# Patient Record
Sex: Female | Born: 1960 | Race: Black or African American | Hispanic: No | Marital: Single | Smoking: Former smoker
Health system: Southern US, Community
[De-identification: ages and names within clinical notes are randomized; demographics above are authoritative.]

## PROBLEM LIST (undated history)

## (undated) DIAGNOSIS — K589 Irritable bowel syndrome without diarrhea: Secondary | ICD-10-CM

## (undated) DIAGNOSIS — J449 Chronic obstructive pulmonary disease, unspecified: Secondary | ICD-10-CM

## (undated) DIAGNOSIS — F419 Anxiety disorder, unspecified: Secondary | ICD-10-CM

## (undated) DIAGNOSIS — E119 Type 2 diabetes mellitus without complications: Secondary | ICD-10-CM

## (undated) DIAGNOSIS — I10 Essential (primary) hypertension: Secondary | ICD-10-CM

## (undated) DIAGNOSIS — G4730 Sleep apnea, unspecified: Secondary | ICD-10-CM

## (undated) DIAGNOSIS — R0600 Dyspnea, unspecified: Secondary | ICD-10-CM

## (undated) DIAGNOSIS — E669 Obesity, unspecified: Secondary | ICD-10-CM

## (undated) DIAGNOSIS — M1990 Unspecified osteoarthritis, unspecified site: Secondary | ICD-10-CM

## (undated) HISTORY — PX: TUBAL LIGATION: SHX77

## (undated) HISTORY — PX: JOINT REPLACEMENT: SHX530

---

## 1988-07-09 HISTORY — PX: TUBAL LIGATION: SHX77

## 2016-07-09 HISTORY — PX: JOINT REPLACEMENT: SHX530

## 2018-01-28 ENCOUNTER — Encounter: Payer: Self-pay | Admitting: Emergency Medicine

## 2018-01-28 ENCOUNTER — Emergency Department: Payer: Medicaid Other

## 2018-01-28 ENCOUNTER — Inpatient Hospital Stay
Admission: EM | Admit: 2018-01-28 | Discharge: 2018-02-03 | DRG: 373 | Disposition: A | Discharge status: Home / Self Care | Payer: Medicaid Other | Attending: Surgery | Admitting: Surgery

## 2018-01-28 ENCOUNTER — Other Ambulatory Visit: Payer: Self-pay

## 2018-01-28 DIAGNOSIS — E119 Type 2 diabetes mellitus without complications: Secondary | ICD-10-CM | POA: Diagnosis present

## 2018-01-28 DIAGNOSIS — I10 Essential (primary) hypertension: Secondary | ICD-10-CM

## 2018-01-28 DIAGNOSIS — E785 Hyperlipidemia, unspecified: Secondary | ICD-10-CM | POA: Diagnosis present

## 2018-01-28 DIAGNOSIS — Z87891 Personal history of nicotine dependence: Secondary | ICD-10-CM

## 2018-01-28 DIAGNOSIS — M79669 Pain in unspecified lower leg: Secondary | ICD-10-CM | POA: Diagnosis present

## 2018-01-28 DIAGNOSIS — K5900 Constipation, unspecified: Secondary | ICD-10-CM | POA: Diagnosis present

## 2018-01-28 DIAGNOSIS — K3532 Acute appendicitis with perforation and localized peritonitis, without abscess: Secondary | ICD-10-CM | POA: Diagnosis present

## 2018-01-28 DIAGNOSIS — K3533 Acute appendicitis with perforation and localized peritonitis, with abscess: Secondary | ICD-10-CM | POA: Diagnosis present

## 2018-01-28 DIAGNOSIS — J449 Chronic obstructive pulmonary disease, unspecified: Secondary | ICD-10-CM | POA: Diagnosis present

## 2018-01-28 DIAGNOSIS — Z882 Allergy status to sulfonamides status: Secondary | ICD-10-CM | POA: Diagnosis not present

## 2018-01-28 DIAGNOSIS — D259 Leiomyoma of uterus, unspecified: Secondary | ICD-10-CM | POA: Diagnosis present

## 2018-01-28 DIAGNOSIS — E1159 Type 2 diabetes mellitus with other circulatory complications: Secondary | ICD-10-CM

## 2018-01-28 DIAGNOSIS — M79606 Pain in leg, unspecified: Secondary | ICD-10-CM

## 2018-01-28 DIAGNOSIS — E1165 Type 2 diabetes mellitus with hyperglycemia: Secondary | ICD-10-CM

## 2018-01-28 DIAGNOSIS — I152 Hypertension secondary to endocrine disorders: Secondary | ICD-10-CM

## 2018-01-28 DIAGNOSIS — K651 Peritoneal abscess: Secondary | ICD-10-CM

## 2018-01-28 HISTORY — DX: Acute appendicitis with perforation, localized peritonitis, and gangrene, with abscess: K35.33

## 2018-01-28 HISTORY — DX: Type 2 diabetes mellitus without complications: E11.9

## 2018-01-28 HISTORY — DX: Chronic obstructive pulmonary disease, unspecified: J44.9

## 2018-01-28 HISTORY — DX: Essential (primary) hypertension: I10

## 2018-01-28 LAB — COMPREHENSIVE METABOLIC PANEL
ALBUMIN: 3.4 g/dL — AB (ref 3.5–5.0)
ALT: 10 U/L (ref 0–44)
AST: 14 U/L — ABNORMAL LOW (ref 15–41)
Alkaline Phosphatase: 92 U/L (ref 38–126)
Anion gap: 10 (ref 5–15)
BUN: 10 mg/dL (ref 6–20)
CHLORIDE: 102 mmol/L (ref 98–111)
CO2: 27 mmol/L (ref 22–32)
CREATININE: 0.7 mg/dL (ref 0.44–1.00)
Calcium: 8.5 mg/dL — ABNORMAL LOW (ref 8.9–10.3)
GFR calc Af Amer: >60 mL/min (ref >60)
GLUCOSE: 106 mg/dL — AB (ref 70–99)
POTASSIUM: 3.6 mmol/L (ref 3.5–5.1)
Sodium: 139 mmol/L (ref 135–145)
Total Bilirubin: 0.9 mg/dL (ref 0.3–1.2)
Total Protein: 8.1 g/dL (ref 6.5–8.1)

## 2018-01-28 LAB — CBC WITH DIFFERENTIAL/PLATELET
BASOS PCT: 0 %
Basophils Absolute: 0.1 10*3/uL (ref 0–0.1)
Eosinophils Absolute: 0.3 10*3/uL (ref 0–0.7)
Eosinophils Relative: 2 %
HCT: 41.9 % (ref 35.0–47.0)
Hemoglobin: 14.1 g/dL (ref 12.0–16.0)
Lymphocytes Relative: 15 %
Lymphs Abs: 2.4 10*3/uL (ref 1.0–3.6)
MCH: 26.9 pg (ref 26.0–34.0)
MCHC: 33.6 g/dL (ref 32.0–36.0)
MCV: 79.9 fL — AB (ref 80.0–100.0)
MONO ABS: 1 10*3/uL — AB (ref 0.2–0.9)
Monocytes Relative: 6 %
Neutro Abs: 12.9 10*3/uL — ABNORMAL HIGH (ref 1.4–6.5)
Neutrophils Relative %: 77 %
Platelets: 290 10*3/uL (ref 150–440)
RBC: 5.24 MIL/uL — AB (ref 3.80–5.20)
RDW: 15.3 % — ABNORMAL HIGH (ref 11.5–14.5)
WBC: 16.7 10*3/uL — ABNORMAL HIGH (ref 3.6–11.0)

## 2018-01-28 LAB — LIPASE, BLOOD: Lipase: 36 U/L (ref 11–51)

## 2018-01-28 LAB — GLUCOSE, CAPILLARY: Glucose-Capillary: 124 mg/dL — ABNORMAL HIGH (ref 70–99)

## 2018-01-28 MED ORDER — TRAMADOL HCL 50 MG PO TABS: 50.0000 mg | ORAL_TABLET | Freq: Four times a day (QID) | ORAL | Status: DC | PRN

## 2018-01-28 MED ORDER — ONDANSETRON 4 MG PO TBDP: 4.0000 mg | ORAL_TABLET | Freq: Four times a day (QID) | ORAL | Status: DC | PRN

## 2018-01-28 MED ORDER — PIPERACILLIN-TAZOBACTAM 3.375 G IVPB 30 MIN
3.3750 g | Freq: Once | INTRAVENOUS | Status: AC
  Administered 2018-01-28: 3.375 g via INTRAVENOUS
  Filled 2018-01-28: qty 50

## 2018-01-28 MED ORDER — PRAVASTATIN SODIUM 20 MG PO TABS
10.0000 mg | ORAL_TABLET | Freq: Every day | ORAL | Status: DC
  Administered 2018-01-29 – 2018-02-03 (×5): 10 mg via ORAL
  Filled 2018-01-28 (×5): qty 1

## 2018-01-28 MED ORDER — INSULIN ASPART 100 UNIT/ML ~~LOC~~ SOLN: 0.0000 [IU] | Freq: Every day | SUBCUTANEOUS | Status: DC

## 2018-01-28 MED ORDER — AMLODIPINE BESYLATE 10 MG PO TABS
10.0000 mg | ORAL_TABLET | Freq: Every day | ORAL | Status: DC
  Administered 2018-01-29 – 2018-02-03 (×5): 10 mg via ORAL
  Filled 2018-01-28 (×5): qty 1

## 2018-01-28 MED ORDER — FUROSEMIDE 40 MG PO TABS
40.0000 mg | ORAL_TABLET | Freq: Every day | ORAL | Status: DC
  Administered 2018-01-29 – 2018-02-03 (×5): 40 mg via ORAL
  Filled 2018-01-28 (×5): qty 1

## 2018-01-28 MED ORDER — ONDANSETRON HCL 4 MG/2ML IJ SOLN
4.0000 mg | Freq: Once | INTRAMUSCULAR | Status: AC
  Administered 2018-01-28: 4 mg via INTRAVENOUS
  Filled 2018-01-28: qty 2

## 2018-01-28 MED ORDER — SODIUM CHLORIDE 0.9 % IV BOLUS
1000.0000 mL | Freq: Once | INTRAVENOUS | Status: AC
  Administered 2018-01-28: 1000 mL via INTRAVENOUS

## 2018-01-28 MED ORDER — MORPHINE SULFATE (PF) 2 MG/ML IV SOLN
2.0000 mg | INTRAVENOUS | Status: DC | PRN
  Administered 2018-01-29 – 2018-02-02 (×12): 2 mg via INTRAVENOUS
  Filled 2018-01-28 (×12): qty 1

## 2018-01-28 MED ORDER — LACTATED RINGERS IV SOLN
INTRAVENOUS | Status: DC
  Administered 2018-01-28 – 2018-01-30 (×4): via INTRAVENOUS

## 2018-01-28 MED ORDER — ONDANSETRON 4 MG PO TBDP
4.0000 mg | ORAL_TABLET | Freq: Once | ORAL | Status: AC | PRN
  Administered 2018-01-28: 4 mg via ORAL
  Filled 2018-01-28: qty 1

## 2018-01-28 MED ORDER — DICYCLOMINE HCL 10 MG PO CAPS
10.0000 mg | ORAL_CAPSULE | Freq: Four times a day (QID) | ORAL | Status: DC
  Administered 2018-01-28 – 2018-02-03 (×20): 10 mg via ORAL
  Filled 2018-01-28 (×20): qty 1

## 2018-01-28 MED ORDER — HYDROCODONE-ACETAMINOPHEN 5-325 MG PO TABS
1.0000 | ORAL_TABLET | ORAL | Status: DC | PRN
  Administered 2018-01-29 – 2018-02-03 (×9): 2 via ORAL
  Filled 2018-01-28 (×9): qty 2

## 2018-01-28 MED ORDER — ONDANSETRON HCL 4 MG/2ML IJ SOLN
4.0000 mg | Freq: Four times a day (QID) | INTRAMUSCULAR | Status: DC | PRN
  Administered 2018-01-30 – 2018-02-02 (×2): 4 mg via INTRAVENOUS
  Filled 2018-01-28 (×2): qty 2

## 2018-01-28 MED ORDER — PIPERACILLIN-TAZOBACTAM 3.375 G IVPB
3.3750 g | Freq: Three times a day (TID) | INTRAVENOUS | Status: DC
  Administered 2018-01-29 – 2018-02-03 (×17): 3.375 g via INTRAVENOUS
  Filled 2018-01-28 (×17): qty 50

## 2018-01-28 MED ORDER — IOPAMIDOL (ISOVUE-300) INJECTION 61%
100.0000 mL | Freq: Once | INTRAVENOUS | Status: AC | PRN
  Administered 2018-01-28: 100 mL via INTRAVENOUS

## 2018-01-28 MED ORDER — INSULIN ASPART 100 UNIT/ML ~~LOC~~ SOLN
0.0000 [IU] | Freq: Three times a day (TID) | SUBCUTANEOUS | Status: DC
  Administered 2018-01-31 – 2018-02-03 (×4): 3 [IU] via SUBCUTANEOUS
  Filled 2018-01-28 (×4): qty 1

## 2018-01-28 MED ORDER — AMITRIPTYLINE HCL 25 MG PO TABS
75.0000 mg | ORAL_TABLET | Freq: Every day | ORAL | Status: DC
  Administered 2018-01-29 – 2018-02-02 (×6): 75 mg via ORAL
  Filled 2018-01-28 (×7): qty 3

## 2018-01-28 MED ORDER — MORPHINE SULFATE (PF) 4 MG/ML IV SOLN
4.0000 mg | Freq: Once | INTRAVENOUS | Status: AC
  Administered 2018-01-28: 4 mg via INTRAVENOUS
  Filled 2018-01-28: qty 1

## 2018-01-28 MED ORDER — ENOXAPARIN SODIUM 40 MG/0.4ML ~~LOC~~ SOLN
40.0000 mg | Freq: Two times a day (BID) | SUBCUTANEOUS | Status: DC
  Administered 2018-01-29 – 2018-02-03 (×10): 40 mg via SUBCUTANEOUS
  Filled 2018-01-28 (×10): qty 0.4

## 2018-01-28 NOTE — ED Provider Notes (Signed)
[facility] Emergency Department Provider Note  ____________________________________________  Time seen: Approximately 6:29 PM  I have reviewed the triage vital signs and the nursing notes.   HISTORY  Chief Complaint Abdominal Pain   HPI Tracy Hood is a 57 y.o. female with a history of COPD, diabetes, hypertension, hyperlipidemia who presents for evaluation of abdominal pain.  Patient reports 4 days of constant sharp right lower quadrant abdominal pain associated with nausea and decreased appetite.  She has had chills but no fever.  She denies ever having similar pain.  Initially she thought the pain was due to gas but the pain is not resolving and is getting progressively worse.  At this time the pain is 8 out of 10.  No prior abdominal surgeries.  No dysuria, hematuria, vaginal bleeding, diarrhea, or vomiting.  She reports being constipated for the last 4 days which is atypical for her.  She took a laxative at home with no resolution.  Past Medical History:  Diagnosis Date  . COPD (chronic obstructive pulmonary disease) ([place])   . Diabetes mellitus without complication ([place])   . Hypertension     Past Surgical History:  Procedure Laterality Date  . JOINT REPLACEMENT    . TUBAL LIGATION      Allergies Sulfa antibiotics  FH Diabetes HTN  Social History Social History   Tobacco Use  . Smoking status: Former Research scientist (life sciences)  . Smokeless tobacco: Never Used  Substance Use Topics  . Alcohol use: Yes  . Drug use: Never    Review of Systems  Constitutional: Negative for fever. Eyes: Negative for visual changes. ENT: Negative for sore throat. Neck: No neck pain  Cardiovascular: Negative for chest pain. Respiratory: Negative for shortness of breath. Gastrointestinal: + R sided abdominal pain, nausea, decrease appetite, and constipation. Genitourinary: Negative for dysuria. Musculoskeletal: Negative for back pain. Skin: Negative for  rash. Neurological: Negative for headaches, weakness or numbness. Psych: No SI or HI  ____________________________________________   PHYSICAL EXAM:  VITAL SIGNS: ED Triage Vitals [01/28/18 1807]  Enc Vitals Group     BP (!) 153/72     Pulse Rate 93     Resp 18     Temp 98.3 F (36.8 C)     Temp Source Oral     SpO2 92 %     Weight 292 lb (132.5 kg)     Height 5\' 7"  (1.702 m)     Head Circumference      Peak Flow      Pain Score 7     Pain Loc      Pain Edu?      Excl. in [place]?     Constitutional: Alert and oriented. Well appearing and in no apparent distress. HEENT:      Head: Normocephalic and atraumatic.         Eyes: Conjunctivae are normal. Sclera is non-icteric.       Mouth/Throat: Mucous membranes are moist.       Neck: Supple with no signs of meningismus. Cardiovascular: Regular rate and rhythm. No murmurs, gallops, or rubs. 2+ symmetrical distal pulses are present in all extremities. No JVD. Respiratory: Normal respiratory effort. Lungs are clear to auscultation bilaterally. No wheezes, crackles, or rhonchi.  Gastrointestinal: Soft, diffuse right-sided tenderness worse in the right lower quadrant, and non distended with positive bowel sounds. No rebound or guarding. Genitourinary: No CVA tenderness. Musculoskeletal: Nontender with normal range of motion in all extremities. No edema, cyanosis, or erythema of  extremities. Neurologic: Normal speech and language. Face is symmetric. Moving all extremities. No gross focal neurologic deficits are appreciated. Skin: Skin is warm, dry and intact. No rash noted. Psychiatric: Mood and affect are normal. Speech and behavior are normal.  ____________________________________________   LABS (all labs ordered are listed, but only abnormal results are displayed)  Labs Reviewed  CBC WITH DIFFERENTIAL/PLATELET - Abnormal; Notable for the following components:      Result Value   WBC 16.7 (*)    RBC 5.24 (*)    MCV 79.9 (*)     RDW 15.3 (*)    Neutro Abs 12.9 (*)    Monocytes Absolute 1.0 (*)    All other components within normal limits  COMPREHENSIVE METABOLIC PANEL - Abnormal; Notable for the following components:   Glucose, Bld 106 (*)    Calcium 8.5 (*)    Albumin 3.4 (*)    AST 14 (*)    All other components within normal limits  LIPASE, BLOOD  URINALYSIS, COMPLETE (UACMP) WITH MICROSCOPIC   ____________________________________________  EKG  none  ____________________________________________  RADIOLOGY  I have personally reviewed the images performed during this visit and I agree with the Radiologist's read.   Interpretation by Radiologist:  No results found.    ____________________________________________   PROCEDURES  Procedure(s) performed: None Procedures Critical Care performed:  Yes  CRITICAL CARE Performed by: Rudene Re  ?  Total critical care time: 30 min  Critical care time was exclusive of separately billable procedures and treating other patients.  Critical care was necessary to treat or prevent imminent or life-threatening deterioration.  Critical care was time spent personally by me on the following activities: development of treatment plan with patient and/or surrogate as well as nursing, discussions with consultants, evaluation of patient's response to treatment, examination of patient, obtaining history from patient or surrogate, ordering and performing treatments and interventions, ordering and review of laboratory studies, ordering and review of radiographic studies, pulse oximetry and re-evaluation of patient's condition.  ____________________________________________   INITIAL IMPRESSION / ASSESSMENT AND PLAN / ED COURSE  57 y.o. female with a history of COPD, diabetes, hypertension, hyperlipidemia who presents for evaluation of R sided abdominal pain, nausea, decreased appetite and constipation x 4 days.  Patient is well-appearing and in no  distress, she has normal vital signs, abdomen is obese with tenderness to palpation on the right side worse on the right lower quadrant, there is no rebound or guarding.  Differential diagnosis includes but not limited to appendicitis versus diverticulitis versus gallbladder disease versus constipation versus SBO versus kidney stone versus pyelonephritis versus ovarian pathology.  Plan for CBC, CMP, lipase, urinalysis.  Patient no longer having menstrual periods.  Plan for CT abdomen pelvis.  Will give morphine, Zofran and fluids for symptom relief.    _________________________ 8:47 PM on 01/28/2018 -----------------------------------------  CT consistent with perforated appendicitis.  White count is 16.  Patient is hemodynamically stable with no signs of sepsis.  Will start patient on Zosyn.  Discussed with Dr. Lysle Pearl, surgeon on call who will come to the ED to evaluate patient for admission.   As part of my medical decision making, I reviewed the following data within the [place] notes reviewed and incorporated, Labs reviewed , Old EKG reviewed, Radiograph reviewed , A consult was requested and obtained from this/these consultant(s) Surgery, Notes from prior ED visits and [place] Controlled Substance Database    Pertinent labs & imaging results that were available during my  care of the patient were reviewed by me and considered in my medical decision making (see chart for details).    ____________________________________________   FINAL CLINICAL IMPRESSION(S) / ED DIAGNOSES  Final diagnoses:  Acute perforated appendicitis      NEW MEDICATIONS STARTED DURING THIS VISIT:  ED Discharge Orders    None       Note:  This document was prepared using Dragon voice recognition software and may include unintentional dictation errors.    Alfred Levins, [place], MD 01/28/18 [phone]

## 2018-01-28 NOTE — ED Notes (Signed)
Pt unable to void at this time - MD aware 

## 2018-01-28 NOTE — Progress Notes (Signed)
Lovenox changed to 40 mg BID for BMI >40 and CrCl >30. 

## 2018-01-28 NOTE — ED Triage Notes (Signed)
PT arrived with concerns with lower right quadrant abdominal pain that started 4 days prior. Pt reports the pain is constant and feels sharp. Pt states she has had a poor appetite.

## 2018-01-28 NOTE — Progress Notes (Signed)
Pharmacy Antibiotic Note  Tracy Hood is a 57 y.o. female admitted on 01/28/2018 with abdominal pain.  Pharmacy has been consulted for Zosyn dosing.  Plan: Zosyn 3.375g IV q8h (4 hour infusion).  Height: 5\' 7"  (170.2 cm) Weight: 292 lb (132.5 kg) IBW/kg (Calculated) : 61.6  Temp (24hrs), Avg:98.2 F (36.8 C), Min:98 F (36.7 C), Max:98.3 F (36.8 C)  Recent Labs  Lab 01/28/18 1839 01/28/18 1923  WBC 16.7*  --   CREATININE  --  0.70    Estimated Creatinine Clearance: 114.2 mL/min (by C-G formula based on SCr of 0.7 mg/dL).    Allergies  Allergen Reactions  . Sulfa Antibiotics     Itching     Antimicrobials this admission: Zosyn 7/23 >>    >>   Dose adjustments this admission:   Microbiology results: No micro  Thank you for allowing pharmacy to be a part of this patient's care.  Derrick Orris S 01/28/2018 11:39 PM

## 2018-01-29 LAB — CBC
HCT: 37.6 % (ref 35.0–47.0)
Hemoglobin: 12.8 g/dL (ref 12.0–16.0)
MCH: 27.7 pg (ref 26.0–34.0)
MCHC: 34.2 g/dL (ref 32.0–36.0)
MCV: 81.2 fL (ref 80.0–100.0)
PLATELETS: 250 10*3/uL (ref 150–440)
RBC: 4.63 MIL/uL (ref 3.80–5.20)
RDW: 15.1 % — ABNORMAL HIGH (ref 11.5–14.5)
WBC: 13.1 10*3/uL — AB (ref 3.6–11.0)

## 2018-01-29 LAB — URINALYSIS, COMPLETE (UACMP) WITH MICROSCOPIC
BACTERIA UA: NONE SEEN
Bilirubin Urine: NEGATIVE
GLUCOSE, UA: NEGATIVE mg/dL
KETONES UR: NEGATIVE mg/dL
Leukocytes, UA: NEGATIVE
NITRITE: NEGATIVE
PROTEIN: NEGATIVE mg/dL
Specific Gravity, Urine: 1.01 (ref 1.005–1.030)
pH: 5 (ref 5.0–8.0)

## 2018-01-29 LAB — BASIC METABOLIC PANEL
Anion gap: 7 (ref 5–15)
BUN: 10 mg/dL (ref 6–20)
CO2: 28 mmol/L (ref 22–32)
Calcium: 8.2 mg/dL — ABNORMAL LOW (ref 8.9–10.3)
Chloride: 104 mmol/L (ref 98–111)
Creatinine, Ser: 0.79 mg/dL (ref 0.44–1.00)
GFR calc non Af Amer: >60 mL/min (ref >60)
Glucose, Bld: 111 mg/dL — ABNORMAL HIGH (ref 70–99)
Potassium: 3.5 mmol/L (ref 3.5–5.1)
Sodium: 139 mmol/L (ref 135–145)

## 2018-01-29 LAB — GLUCOSE, CAPILLARY
GLUCOSE-CAPILLARY: 98 mg/dL (ref 70–99)
GLUCOSE-CAPILLARY: 104 mg/dL — AB (ref 70–99)
Glucose-Capillary: 107 mg/dL — ABNORMAL HIGH (ref 70–99)
Glucose-Capillary: 89 mg/dL (ref 70–99)

## 2018-01-29 MED ORDER — ACETAMINOPHEN 325 MG PO TABS
650.0000 mg | ORAL_TABLET | Freq: Four times a day (QID) | ORAL | Status: DC | PRN
  Administered 2018-01-29 – 2018-01-30 (×2): 650 mg via ORAL
  Filled 2018-01-29 (×2): qty 2

## 2018-01-29 MED ORDER — SODIUM CHLORIDE 0.9 % IV SOLN
INTRAVENOUS | Status: DC
  Administered 2018-01-30: 11:00:00 via INTRAVENOUS

## 2018-01-29 NOTE — Progress Notes (Signed)
Subjective:    Tracy Hood is a 57 y.o. female  Hospital stay day 1,   perforate appendicitis  Feeling slightly better today.   Objective:      Temp:  [98 F (36.7 C)] 98 F (36.7 C) (07/23 2326) Pulse Rate:  [57-85] 85 (07/24 1141) Resp:  [16-18] 18 (07/23 2326) BP: (129-173)/(68-83) 131/72 (07/24 1141) SpO2:  [89 %-100 %] 100 % (07/24 1141)     Height: 5\' 7"  (170.2 cm) Weight: 132.5 kg (292 lb) BMI (Calculated): 45.72   Intake/Output this shift:  No intake/output data recorded.   Intake/Output last 2 shifts:  @IOLAST2SHIFTS @    Constitutional :  alert, cooperative, appears stated age and no distress  Respiratory:  clear to auscultation bilaterally  Cardiovascular:  regular rate and rhythm, S1, S2 normal, no murmur, click, rub or gallop  Gastrointestinal: soft, point tenderness along right side and suprapubic region.   Skin: Cool and moist.   Psychiatric: Normal affect, non-agitated, not confused       LABS:  CMP Latest Ref Rng & Units 01/29/2018 01/28/2018  Glucose 70 - 99 mg/dL 111(H) 106(H)  BUN 6 - 20 mg/dL 10 10  Creatinine 0.44 - 1.00 mg/dL 0.79 0.70  Sodium 135 - 145 mmol/L 139 139  Potassium 3.5 - 5.1 mmol/L 3.5 3.6  Chloride 98 - 111 mmol/L 104 102  CO2 22 - 32 mmol/L 28 27  Calcium 8.9 - 10.3 mg/dL 8.2(L) 8.5(L)  Total Protein 6.5 - 8.1 g/dL - 8.1  Total Bilirubin 0.3 - 1.2 mg/dL - 0.9  Alkaline Phos 38 - 126 U/L - 92  AST 15 - 41 U/L - 14(L)  ALT 0 - 44 U/L - 10   CBC Latest Ref Rng & Units 01/29/2018 01/28/2018  WBC 3.6 - 11.0 K/uL 13.1(H) 16.7(H)  Hemoglobin 12.0 - 16.0 g/dL 12.8 14.1  Hematocrit 35.0 - 47.0 % 37.6 41.9  Platelets 150 - 440 K/uL 250 290    RADS: none Assessment:     perforated appendicitis, wbc improving with npo, abx.  IR consult for percutaneous drainage of appendiceal abscess tomorrow. Plan:   IVF: LR@100ml  Abx: cont zosyn DVT prophy: lovenox, SCDs GI prophy: pepcid Diet: NPO, sips with meds Pain control: prn I&O:  qshift Activity: adlib Labs: am labs

## 2018-01-29 NOTE — H&P (Signed)
Subjective:  LATE ENTRY CC: perforated appendicitis  HPI:  Tracy Hood is a 57 y.o. female who is consulted for evaluation of  Perforated appendicitis. Symptoms were first noted 4 days ago. Pain is sharp, localized to RLQ and infraumbilcal area.  Associated with some nausea, exacerbated by nothing specific.  Pain continued to worsen so came to ED, where CT scan showed perforated appendicitis.     Past Medical History:  has a past medical history of COPD (chronic obstructive pulmonary disease) ([place]), Diabetes mellitus without complication ([place]), and Hypertension.  Past Surgical History:  has a past surgical history that includes Joint replacement and Tubal ligation.  Family History: family history is not on file.  Social History:  reports that she has quit smoking. She has never used smokeless tobacco. She reports that she drinks alcohol. She reports that she does not use drugs.  Current Medications: see MAR  Allergies:  Allergies as of 01/28/2018 - Review Complete 01/28/2018  Allergen Reaction Noted  . Sulfa antibiotics  01/28/2018    ROS:  A 15 point review of systems was performed and was negative except as noted in HPI    Objective:     BP (!) 142/83 (BP Location: Right Arm)   Pulse (!) 57   Temp 98 F (36.7 C) (Oral)   Resp 18   Ht 5\' 7"  (1.702 m)   Wt 132.5 kg (292 lb)   SpO2 99%   BMI 45.73 kg/m    Constitutional :  alert, cooperative, appears stated age and no distress  Lymphatics/Throat:  no asymmetry, masses, or scars  Respiratory:  clear to auscultation bilaterally  Cardiovascular:  regular rate and rhythm, S1, S2 normal, no murmur, click, rub or gallop  Gastrointestinal: soft, but focal tenderness in RLQ, periumbilical region.  No rebound or guarding.   Musculoskeletal: Steady gait and movement  Skin: Cool and moist, no obvious surgical scars  Psychiatric: Normal affect, non-agitated, not confused       LABS:  CMP Latest Ref Rng & Units 01/29/2018  01/28/2018  Glucose 70 - 99 mg/dL 111(H) 106(H)  BUN 6 - 20 mg/dL 10 10  Creatinine 0.44 - 1.00 mg/dL 0.79 0.70  Sodium 135 - 145 mmol/L 139 139  Potassium 3.5 - 5.1 mmol/L 3.5 3.6  Chloride 98 - 111 mmol/L 104 102  CO2 22 - 32 mmol/L 28 27  Calcium 8.9 - 10.3 mg/dL 8.2(L) 8.5(L)  Total Protein 6.5 - 8.1 g/dL - 8.1  Total Bilirubin 0.3 - 1.2 mg/dL - 0.9  Alkaline Phos 38 - 126 U/L - 92  AST 15 - 41 U/L - 14(L)  ALT 0 - 44 U/L - 10   CBC Latest Ref Rng & Units 01/29/2018 01/28/2018  WBC 3.6 - 11.0 K/uL 13.1(H) 16.7(H)  Hemoglobin 12.0 - 16.0 g/dL 12.8 14.1  Hematocrit 35.0 - 47.0 % 37.6 41.9  Platelets 150 - 440 K/uL 250 290     RADS:  CLINICAL DATA:  Right lower quadrant abdominal pain  EXAM: CT ABDOMEN AND PELVIS WITH CONTRAST  TECHNIQUE: Multidetector CT imaging of the abdomen and pelvis was performed using the standard protocol following bolus administration of intravenous contrast.  CONTRAST:  136mL ISOVUE-300 IOPAMIDOL (ISOVUE-300) INJECTION 61%  COMPARISON:  None.  FINDINGS: Lower chest: Lung bases demonstrate no acute consolidation or pleural effusion. Borderline cardiomegaly. Mitral calcification. Small hiatal hernia  Hepatobiliary: No focal liver abnormality is seen. No gallstones, gallbladder wall thickening, or biliary dilatation.  Pancreas: Unremarkable. No pancreatic ductal dilatation  or surrounding inflammatory changes.  Spleen: Normal in size without focal abnormality.  Adrenals/Urinary Tract: Adrenal glands are unremarkable. Kidneys are normal, without renal calculi, focal lesion, or hydronephrosis. Bladder is unremarkable.  Stomach/Bowel: The stomach is nonenlarged. No dilated small bowel. Enlarged inflamed appendix, measuring up to 14 mm. 3.8 cm gas and fluid collection in the right lower quadrant adjacent to the tip of the appendix. Punctate calcification in the right lower quadrant near the inflammatory process. No colon wall  thickening  Vascular/Lymphatic: Mild aortic atherosclerosis. No aneurysm. No significantly enlarged lymph nodes  Reproductive: Markedly enlarged uterus which extends to the level of the umbilicus. No adnexal mass  Other: Negative for free air. Small amount of fluid in the right lower quadrant. Small fat containing umbilical and periumbilical hernia with small calcification at the base of the umbilicus.  Musculoskeletal: Degenerative changes with trace anterolisthesis L4 on L5. No acute or suspicious abnormality  IMPRESSION: 1. Findings are consistent with acute perforated appendicitis. Perforation appears contained in the right lower quadrant where there is a 3.8 cm gas and fluid collection.  Appendix: Location: Right lower quadrant  Diameter: 14 mm  Appendicolith: Punctate stone in the right lower quadrant may reflect punctate appendicoliths but appears extraluminal.  Mucosal hyper-enhancement: Negative  Extraluminal gas: Positive for 3.8 cm gas and fluid collection in the right lower quadrant near the tip of the appendix.  Periappendiceal collection: Positive for 3.8 cm gas and fluid collection in the right lower quadrant near the tip of the appendix.  2. Massive enlargement of the uterus which extends to the level of the umbilicus, probably due to multiple large uterine fibroids.  Critical Value/emergent results were called by telephone at the time of interpretation on 01/28/2018 at 8:46 pm to Dr. Rudene Re , who verbally acknowledged these results.   Electronically Signed   By: Donavan Foil M.D.   On: 01/28/2018 20:46 Assessment:     perforated appendicitis.  Clinically stable.  Plan:      Due to abscess formation and duration of symptoms, will complete abx course and consider interval appendectomy.  Admit: medsurg IVF: LR@100ml  Abx: zosyn DVT prophy: lovenox, SCDs GI prophy: pepcid Diet: NPO, sips with meds Pain control:  prn I&O: qshift Activity: adlib Labs: daily cbc, bmp  COPD -stable.. Cont to monitor  DM -SSI  HTN -cont home meds

## 2018-01-30 ENCOUNTER — Inpatient Hospital Stay: Payer: Medicaid Other

## 2018-01-30 LAB — BASIC METABOLIC PANEL
Anion gap: 10 (ref 5–15)
BUN: 11 mg/dL (ref 6–20)
CHLORIDE: 102 mmol/L (ref 98–111)
CO2: 27 mmol/L (ref 22–32)
Calcium: 8.2 mg/dL — ABNORMAL LOW (ref 8.9–10.3)
Creatinine, Ser: 0.72 mg/dL (ref 0.44–1.00)
GFR calc Af Amer: >60 mL/min (ref >60)
GFR calc non Af Amer: >60 mL/min (ref >60)
GLUCOSE: 104 mg/dL — AB (ref 70–99)
POTASSIUM: 3.6 mmol/L (ref 3.5–5.1)
Sodium: 139 mmol/L (ref 135–145)

## 2018-01-30 LAB — CBC WITH DIFFERENTIAL/PLATELET
BASOS ABS: 0.1 10*3/uL (ref 0–0.1)
BASOS PCT: 1 %
EOS ABS: 0.2 10*3/uL (ref 0–0.7)
Eosinophils Relative: 2 %
HEMATOCRIT: 38.3 % (ref 35.0–47.0)
HEMOGLOBIN: 12.7 g/dL (ref 12.0–16.0)
Lymphocytes Relative: 9 %
Lymphs Abs: 1.1 10*3/uL (ref 1.0–3.6)
MCH: 26.9 pg (ref 26.0–34.0)
MCHC: 33.1 g/dL (ref 32.0–36.0)
MCV: 81.2 fL (ref 80.0–100.0)
MONOS PCT: 7 %
Monocytes Absolute: 0.8 10*3/uL (ref 0.2–0.9)
NEUTROS ABS: 9.7 10*3/uL — AB (ref 1.4–6.5)
NEUTROS PCT: 81 %
Platelets: 243 10*3/uL (ref 150–440)
RBC: 4.72 MIL/uL (ref 3.80–5.20)
RDW: 14.7 % — ABNORMAL HIGH (ref 11.5–14.5)
WBC: 12 10*3/uL — AB (ref 3.6–11.0)

## 2018-01-30 LAB — GLUCOSE, CAPILLARY
Glucose-Capillary: 109 mg/dL — ABNORMAL HIGH (ref 70–99)
Glucose-Capillary: 81 mg/dL (ref 70–99)
Glucose-Capillary: 61 mg/dL — ABNORMAL LOW (ref 70–99)
Glucose-Capillary: 119 mg/dL — ABNORMAL HIGH (ref 70–99)
Glucose-Capillary: 161 mg/dL — ABNORMAL HIGH (ref 70–99)

## 2018-01-30 LAB — HIV ANTIBODY (ROUTINE TESTING W REFLEX): HIV Screen 4th Generation wRfx: NONREACTIVE

## 2018-01-30 LAB — PROTIME-INR
INR: 1.08
Prothrombin Time: 13.9 seconds (ref 11.4–15.2)

## 2018-01-30 LAB — APTT: APTT: 38 s — AB (ref 24–36)

## 2018-01-30 MED ORDER — FENTANYL CITRATE (PF) 100 MCG/2ML IJ SOLN
INTRAMUSCULAR | Status: AC | PRN
  Administered 2018-01-30: 25 ug via INTRAVENOUS
  Administered 2018-01-30: 50 ug via INTRAVENOUS

## 2018-01-30 MED ORDER — MIDAZOLAM HCL 5 MG/5ML IJ SOLN
INTRAMUSCULAR | Status: AC | PRN
  Administered 2018-01-30: 0.5 mg via INTRAVENOUS
  Administered 2018-01-30: 1 mg via INTRAVENOUS

## 2018-01-30 MED ORDER — LIDOCAINE HCL (PF) 1 % IJ SOLN
INTRAMUSCULAR | Status: AC | PRN
  Administered 2018-01-30: 10 mL

## 2018-01-30 MED ORDER — HYDROMORPHONE HCL 1 MG/ML IJ SOLN
0.5000 mg | Freq: Once | INTRAMUSCULAR | Status: AC
  Administered 2018-01-30: 0.5 mg via INTRAVENOUS
  Filled 2018-01-30: qty 0.5

## 2018-01-30 MED ORDER — FENTANYL CITRATE (PF) 100 MCG/2ML IJ SOLN
INTRAMUSCULAR | Status: AC
  Administered 2018-01-30: 12:00:00
  Filled 2018-01-30: qty 4

## 2018-01-30 MED ORDER — MIDAZOLAM HCL 5 MG/5ML IJ SOLN
INTRAMUSCULAR | Status: AC
  Administered 2018-01-30: 12:00:00
  Filled 2018-01-30: qty 5

## 2018-01-30 MED ORDER — SODIUM CHLORIDE 0.9% FLUSH
5.0000 mL | Freq: Three times a day (TID) | INTRAVENOUS | Status: DC
Start: 2018-01-30 — End: 2018-02-03
  Administered 2018-01-30 – 2018-02-03 (×11): 5 mL

## 2018-01-30 NOTE — Progress Notes (Signed)
Subjective:  LATE ENTRY  Tracy Hood is a 57 y.o. female  Hospital stay day 2,   perforated appendicitis  No issues overnight.  Pending IR drain placement   Objective:      Temp:  [97.6 F (36.4 C)-100.5 F (38.1 C)] 100.5 F (38.1 C) (07/25 2034) Pulse Rate:  [71-106] 106 (07/25 2034) Resp:  [14-18] 18 (07/25 2034) BP: (137-159)/(61-88) 141/69 (07/25 2034) SpO2:  [93 %-99 %] 94 % (07/25 2034)     Height: 5\' 7"  (170.2 cm) Weight: 132.5 kg (292 lb) BMI (Calculated): 45.72   Intake/Output this shift:  Total I/O In: -  Out: 700 [Urine:700]   Intake/Output last 2 shifts:     Constitutional :  alert, cooperative and appears stated age  Respiratory:  clear to auscultation bilaterally  Cardiovascular:  regular rate and rhythm, S1, S2 normal, no murmur, click, rub or gallop  Gastrointestinal: soft, with focal tenderness in RLQ, unchanged from previous.   Skin: Cool and moist.   Psychiatric: Normal affect, non-agitated, not confused       LABS:  CMP Latest Ref Rng & Units 01/30/2018 01/29/2018 01/28/2018  Glucose 70 - 99 mg/dL 104(H) 111(H) 106(H)  BUN 6 - 20 mg/dL 11 10 10   Creatinine 0.44 - 1.00 mg/dL 0.72 0.79 0.70  Sodium 135 - 145 mmol/L 139 139 139  Potassium 3.5 - 5.1 mmol/L 3.6 3.5 3.6  Chloride 98 - 111 mmol/L 102 104 102  CO2 22 - 32 mmol/L 27 28 27   Calcium 8.9 - 10.3 mg/dL 8.2(L) 8.2(L) 8.5(L)  Total Protein 6.5 - 8.1 g/dL - - 8.1  Total Bilirubin 0.3 - 1.2 mg/dL - - 0.9  Alkaline Phos 38 - 126 U/L - - 92  AST 15 - 41 U/L - - 14(L)  ALT 0 - 44 U/L - - 10   CBC Latest Ref Rng & Units 01/30/2018 01/29/2018 01/28/2018  WBC 3.6 - 11.0 K/uL 12.0(H) 13.1(H) 16.7(H)  Hemoglobin 12.0 - 16.0 g/dL 12.7 12.8 14.1  Hematocrit 35.0 - 47.0 % 38.3 37.6 41.9  Platelets 150 - 440 K/uL 243 250 290    RADS: n/a Assessment:        Perforated appendicitis, pending IR drainage.  Continue present management until drain placed, then ok for CLD

## 2018-01-30 NOTE — Procedures (Signed)
Interventional Radiology Procedure Note  Procedure: CT guided drainage of appendiceal abscess  Complications: None  Estimated Blood Loss: < 10 mL  Findings: Aspiration of appendiceal abscess yielded purulent, feculent, bloody fluid.  Sample sent for culture. 10 Fr drain placed and formed in abscess.  Attached to suction bulb drainage.  Venetia Night. Kathlene Cote, M.D Pager:  [phone]

## 2018-01-30 NOTE — Progress Notes (Signed)
Interventional Radiology  CT reviewed from 7/23.  Fairly small, early appendiceal abscess with not much fluid component, but should be able to get a small drain into area under CT guidance today.    Risks and benefits discussed with the patient including bleeding, infection, damage to adjacent structures, bowel perforation/fistula connection, and sepsis. All of the patient's questions were answered, patient is agreeable to proceed. Consent signed and in chart.    Tracy Hood. Kathlene Cote, M.D Pager:  [phone]

## 2018-01-31 ENCOUNTER — Inpatient Hospital Stay: Payer: Medicaid Other

## 2018-01-31 LAB — BASIC METABOLIC PANEL
Anion gap: 6 (ref 5–15)
BUN: 9 mg/dL (ref 6–20)
CALCIUM: 8.4 mg/dL — AB (ref 8.9–10.3)
CO2: 31 mmol/L (ref 22–32)
CREATININE: 0.64 mg/dL (ref 0.44–1.00)
Chloride: 104 mmol/L (ref 98–111)
GFR calc Af Amer: >60 mL/min (ref >60)
GFR calc non Af Amer: >60 mL/min (ref >60)
GLUCOSE: 116 mg/dL — AB (ref 70–99)
Potassium: 3.6 mmol/L (ref 3.5–5.1)
Sodium: 141 mmol/L (ref 135–145)

## 2018-01-31 LAB — CBC WITH DIFFERENTIAL/PLATELET
Basophils Absolute: 0 10*3/uL (ref 0–0.1)
Basophils Relative: 0 %
Eosinophils Absolute: 0.2 10*3/uL (ref 0–0.7)
Eosinophils Relative: 1 %
HCT: 39.3 % (ref 35.0–47.0)
Hemoglobin: 12.7 g/dL (ref 12.0–16.0)
LYMPHS ABS: 1.4 10*3/uL (ref 1.0–3.6)
Lymphocytes Relative: 11 %
MCH: 26.7 pg (ref 26.0–34.0)
MCHC: 32.4 g/dL (ref 32.0–36.0)
MCV: 82.5 fL (ref 80.0–100.0)
MONO ABS: 0.8 10*3/uL (ref 0.2–0.9)
MONOS PCT: 6 %
NEUTROS ABS: 11.3 10*3/uL — AB (ref 1.4–6.5)
Neutrophils Relative %: 82 %
Platelets: 244 10*3/uL (ref 150–440)
RBC: 4.76 MIL/uL (ref 3.80–5.20)
RDW: 14.8 % — AB (ref 11.5–14.5)
WBC: 13.7 10*3/uL — ABNORMAL HIGH (ref 3.6–11.0)

## 2018-01-31 LAB — GLUCOSE, CAPILLARY
GLUCOSE-CAPILLARY: 127 mg/dL — AB (ref 70–99)
GLUCOSE-CAPILLARY: 154 mg/dL — AB (ref 70–99)
Glucose-Capillary: 103 mg/dL — ABNORMAL HIGH (ref 70–99)
Glucose-Capillary: 75 mg/dL (ref 70–99)

## 2018-01-31 LAB — PHOSPHORUS: PHOSPHORUS: 3.7 mg/dL (ref 2.5–4.6)

## 2018-01-31 LAB — MAGNESIUM: Magnesium: 1.9 mg/dL (ref 1.7–2.4)

## 2018-01-31 NOTE — Progress Notes (Signed)
Subjective:    Tracy Hood is a 57 y.o. female  Hospital stay day 3,   perforated appendicitis  Drain placed overnight.  Sore at insertion site   Objective:      Temp:  [98 F (36.7 C)-100.5 F (38.1 C)] 98.1 F (36.7 C) (07/26 0358) Pulse Rate:  [72-106] 82 (07/26 1004) Resp:  [14-18] 16 (07/26 0358) BP: (137-159)/(61-88) 142/70 (07/26 1004) SpO2:  [93 %-99 %] 95 % (07/26 0358)     Height: 5\' 7"  (170.2 cm) Weight: 132.5 kg (292 lb) BMI (Calculated): 45.72   Intake/Output this shift:  No intake/output data recorded.   Intake/Output last 2 shifts:     Constitutional :  alert, cooperative and appears stated age  Respiratory:  clear to auscultation bilaterally  Cardiovascular:  regular rate and rhythm, S1, S2 normal, no murmur, click, rub or gallop  Gastrointestinal: soft except at RLQ and around drain site where she has focal guarding, not siginificantly different from previous exam.  Drain with some purulent drainage.   Skin: Cool and moist.   Psychiatric: Normal affect, non-agitated, not confused       LABS:  CMP Latest Ref Rng & Units 01/31/2018 01/30/2018 01/29/2018  Glucose 70 - 99 mg/dL 116(H) 104(H) 111(H)  BUN 6 - 20 mg/dL 9 11 10   Creatinine 0.44 - 1.00 mg/dL 0.64 0.72 0.79  Sodium 135 - 145 mmol/L 141 139 139  Potassium 3.5 - 5.1 mmol/L 3.6 3.6 3.5  Chloride 98 - 111 mmol/L 104 102 104  CO2 22 - 32 mmol/L 31 27 28   Calcium 8.9 - 10.3 mg/dL 8.4(L) 8.2(L) 8.2(L)  Total Protein 6.5 - 8.1 g/dL - - -  Total Bilirubin 0.3 - 1.2 mg/dL - - -  Alkaline Phos 38 - 126 U/L - - -  AST 15 - 41 U/L - - -  ALT 0 - 44 U/L - - -   CBC Latest Ref Rng & Units 01/31/2018 01/30/2018 01/29/2018  WBC 3.6 - 11.0 K/uL 13.7(H) 12.0(H) 13.1(H)  Hemoglobin 12.0 - 16.0 g/dL 12.7 12.7 12.8  Hematocrit 35.0 - 47.0 % 39.3 38.3 37.6  Platelets 150 - 440 K/uL 244 243 250    RADS: none Assessment:      S/p IR drainage of perforated appendicitis abscess.  Will continue to monitor wbc,  serial abd exams, ok to cont CLD until symptoms improve.  She did endorse some lower leg pain in calfs so will order doppler for DVT.    COPD -stable.. Cont to monitor  DM -SSI  HTN -cont home meds

## 2018-01-31 NOTE — Progress Notes (Signed)
Pharmacy Antibiotic Note  Tracy Hood is a 57 y.o. female admitted on 01/28/2018 with abdominal pain.  Pharmacy has been consulted for Zosyn dosing. No significant changes in renal function have occurred sine the last note. Leukocytosis has improved somewhat, however her WBC remains elevated.  Plan: Zosyn 3.375g IV q8h (4 hour infusion). : continue current dose  Height: 5\' 7"  (170.2 cm) Weight: 292 lb (132.5 kg) IBW/kg (Calculated) : 61.6  Temp (24hrs), Avg:98.9 F (37.2 C), Min:98 F (36.7 C), Max:100.5 F (38.1 C)  Recent Labs  Lab 01/28/18 1839 01/28/18 1923 01/29/18 0548 01/30/18 0824 01/31/18 0558  WBC 16.7*  --  13.1* 12.0* 13.7*  CREATININE  --  0.70 0.79 0.72 0.64    Estimated Creatinine Clearance: 114.2 mL/min (by C-G formula based on SCr of 0.64 mg/dL).    Allergies  Allergen Reactions  . Sulfa Antibiotics     Itching     Antimicrobials this admission: Zosyn 7/23 >>   Microbiology results: 7/25: appendiceal fluid: pending  Thank you for allowing pharmacy to be a part of this patient's care.  Dallie Piles, PharmD 01/31/2018 10:49 AM

## 2018-01-31 NOTE — Care Management (Signed)
Patient S/p IR drainage of perforated appendicitis abscess.  Patient listed as self pay.  Currently receiving IV antibiotics.  RNCM following for discharge needs and medication.  Applications for Medication Management , [facility], "[employer]:  Your Guide to Free and [employer] in [facility]"  [place] on chart for discharge.

## 2018-02-01 LAB — GLUCOSE, CAPILLARY
GLUCOSE-CAPILLARY: 99 mg/dL (ref 70–99)
GLUCOSE-CAPILLARY: 99 mg/dL (ref 70–99)
Glucose-Capillary: 136 mg/dL — ABNORMAL HIGH (ref 70–99)
Glucose-Capillary: 88 mg/dL (ref 70–99)

## 2018-02-01 LAB — BASIC METABOLIC PANEL
ANION GAP: 8 (ref 5–15)
BUN: 9 mg/dL (ref 6–20)
CHLORIDE: 101 mmol/L (ref 98–111)
CO2: 32 mmol/L (ref 22–32)
Calcium: 8.2 mg/dL — ABNORMAL LOW (ref 8.9–10.3)
Creatinine, Ser: 0.68 mg/dL (ref 0.44–1.00)
Glucose, Bld: 103 mg/dL — ABNORMAL HIGH (ref 70–99)
POTASSIUM: 3.5 mmol/L (ref 3.5–5.1)
SODIUM: 141 mmol/L (ref 135–145)

## 2018-02-01 LAB — CBC WITH DIFFERENTIAL/PLATELET
BASOS ABS: 0 10*3/uL (ref 0–0.1)
Basophils Relative: 0 %
EOS ABS: 0.5 10*3/uL (ref 0–0.7)
EOS PCT: 5 %
HCT: 37.9 % (ref 35.0–47.0)
HEMOGLOBIN: 12.7 g/dL (ref 12.0–16.0)
LYMPHS ABS: 1.1 10*3/uL (ref 1.0–3.6)
LYMPHS PCT: 13 %
MCH: 27.4 pg (ref 26.0–34.0)
MCHC: 33.4 g/dL (ref 32.0–36.0)
MCV: 82 fL (ref 80.0–100.0)
Monocytes Absolute: 0.7 10*3/uL (ref 0.2–0.9)
Monocytes Relative: 8 %
NEUTROS PCT: 74 %
Neutro Abs: 6.3 10*3/uL (ref 1.4–6.5)
PLATELETS: 273 10*3/uL (ref 150–440)
RBC: 4.62 MIL/uL (ref 3.80–5.20)
RDW: 14.8 % — ABNORMAL HIGH (ref 11.5–14.5)
WBC: 8.7 10*3/uL (ref 3.6–11.0)

## 2018-02-01 LAB — PHOSPHORUS: PHOSPHORUS: 2.8 mg/dL (ref 2.5–4.6)

## 2018-02-01 LAB — MAGNESIUM: MAGNESIUM: 1.9 mg/dL (ref 1.7–2.4)

## 2018-02-01 NOTE — Progress Notes (Signed)
Patient ID: Tracy Hood, female   DOB: 11/21/1960, 57 y.o.   MRN: [id number]     [facility] Day(s): 4.   Post op day(s):  Marland Kitchen   Interval History: Patient seen and examined, no acute events or new complaints overnight. Patient reports still with significant pain with some improvement after drainage. Denies nausea or vomiting.   Vital signs in last 24 hours: [min-max] current  Temp:  [97.5 F (36.4 C)-98.2 F (36.8 C)] 97.5 F (36.4 C) (07/27 0457) Pulse Rate:  [63-82] 77 (07/27 0457) Resp:  [17-20] 20 (07/27 0457) BP: (127-147)/(58-75) 146/75 (07/27 0457) SpO2:  [91 %-94 %] 91 % (07/27 0457)     Height: 5\' 7"  (170.2 cm) Weight: 132.5 kg (292 lb) BMI (Calculated): 45.72    Physical Exam:  Constitutional: alert, cooperative and no distress  Gastrointestinal: soft, moderate-tender on right lower quadrant, and non-distended. No guarding, no rebound tenderness. Drain in place with serous drainage.   Labs:  CBC Latest Ref Rng & Units 02/01/2018 01/31/2018 01/30/2018  WBC 3.6 - 11.0 K/uL 8.7 13.7(H) 12.0(H)  Hemoglobin 12.0 - 16.0 g/dL 12.7 12.7 12.7  Hematocrit 35.0 - 47.0 % 37.9 39.3 38.3  Platelets 150 - 440 K/uL 273 244 243   CMP Latest Ref Rng & Units 02/01/2018 01/31/2018 01/30/2018  Glucose 70 - 99 mg/dL 103(H) 116(H) 104(H)  BUN 6 - 20 mg/dL 9 9 11   Creatinine 0.44 - 1.00 mg/dL 0.68 0.64 0.72  Sodium 135 - 145 mmol/L 141 141 139  Potassium 3.5 - 5.1 mmol/L 3.5 3.6 3.6  Chloride 98 - 111 mmol/L 101 104 102  CO2 22 - 32 mmol/L 32 31 27  Calcium 8.9 - 10.3 mg/dL 8.2(L) 8.4(L) 8.2(L)  Total Protein 6.5 - 8.1 g/dL - - -  Total Bilirubin 0.3 - 1.2 mg/dL - - -  Alkaline Phos 38 - 126 U/L - - -  AST 15 - 41 U/L - - -  ALT 0 - 44 U/L - - -    Assessment/Plan:  57 y.o. female with perforated appendicitis status post percutaneous drainage of abscess. Patient with improved WBC count and no fever in last 24 hours but pain still significant. Significant pain  correlates with inflammation of adjacent bowel. Will keep in clear liquids until pain improves. Still need IV abx therapy.   Arnold Long, MD

## 2018-02-02 LAB — PHOSPHORUS: Phosphorus: 4 mg/dL (ref 2.5–4.6)

## 2018-02-02 LAB — CBC WITH DIFFERENTIAL/PLATELET
BASOS ABS: 0 10*3/uL (ref 0–0.1)
Basophils Relative: 1 %
EOS PCT: 5 %
Eosinophils Absolute: 0.4 10*3/uL (ref 0–0.7)
HCT: 37.8 % (ref 35.0–47.0)
HEMOGLOBIN: 12.7 g/dL (ref 12.0–16.0)
LYMPHS ABS: 1 10*3/uL (ref 1.0–3.6)
Lymphocytes Relative: 14 %
MCH: 27.2 pg (ref 26.0–34.0)
MCHC: 33.6 g/dL (ref 32.0–36.0)
MCV: 80.8 fL (ref 80.0–100.0)
MONO ABS: 0.8 10*3/uL (ref 0.2–0.9)
MONOS PCT: 10 %
NEUTROS ABS: 5.4 10*3/uL (ref 1.4–6.5)
NEUTROS PCT: 70 %
PLATELETS: 298 10*3/uL (ref 150–440)
RBC: 4.68 MIL/uL (ref 3.80–5.20)
RDW: 14.6 % — ABNORMAL HIGH (ref 11.5–14.5)
WBC: 7.6 10*3/uL (ref 3.6–11.0)

## 2018-02-02 LAB — BASIC METABOLIC PANEL
ANION GAP: 9 (ref 5–15)
BUN: 8 mg/dL (ref 6–20)
CHLORIDE: 99 mmol/L (ref 98–111)
CO2: 32 mmol/L (ref 22–32)
Calcium: 8.4 mg/dL — ABNORMAL LOW (ref 8.9–10.3)
Creatinine, Ser: 0.61 mg/dL (ref 0.44–1.00)
GFR calc Af Amer: >60 mL/min (ref >60)
GLUCOSE: 102 mg/dL — AB (ref 70–99)
POTASSIUM: 3.4 mmol/L — AB (ref 3.5–5.1)
Sodium: 140 mmol/L (ref 135–145)

## 2018-02-02 LAB — GLUCOSE, CAPILLARY
GLUCOSE-CAPILLARY: 99 mg/dL (ref 70–99)
GLUCOSE-CAPILLARY: 131 mg/dL — AB (ref 70–99)
GLUCOSE-CAPILLARY: 92 mg/dL (ref 70–99)
GLUCOSE-CAPILLARY: 120 mg/dL — AB (ref 70–99)

## 2018-02-02 LAB — MAGNESIUM: Magnesium: 1.9 mg/dL (ref 1.7–2.4)

## 2018-02-02 NOTE — Progress Notes (Signed)
Patient ID: Juliann Olesky, female   DOB: [birth year], 57 y.o.   MRN: [id number]     [facility] Day(s): 5.   Post op day(s):  Marland Kitchen   Interval History: Patient seen and examined, no acute events or new complaints overnight. Patient reports much improved pain. Still with soreness.  Vital signs in last 24 hours: [min-max] current  Temp:  [98.4 F (36.9 C)-99 F (37.2 C)] 99 F (37.2 C) (07/28 0435) Pulse Rate:  [63-82] 63 (07/28 0435) Resp:  [16-20] 20 (07/28 0435) BP: (141-154)/(68-79) 141/68 (07/28 1005) SpO2:  [79 %-95 %] 95 % (07/28 0435)     Height: 5\' 7"  (170.2 cm) Weight: 132.5 kg (292 lb) BMI (Calculated): 45.72    Physical Exam:  Constitutional: alert, cooperative and no distress  Cardiovascular: regular rate and sinus rhythm  Gastrointestinal: soft, mild-tender on right lower quadrant, and non-distended  Labs:  CBC Latest Ref Rng & Units 02/02/2018 02/01/2018 01/31/2018  WBC 3.6 - 11.0 K/uL 7.6 8.7 13.7(H)  Hemoglobin 12.0 - 16.0 g/dL 12.7 12.7 12.7  Hematocrit 35.0 - 47.0 % 37.8 37.9 39.3  Platelets 150 - 440 K/uL 298 273 244   CMP Latest Ref Rng & Units 02/02/2018 02/01/2018 01/31/2018  Glucose 70 - 99 mg/dL 102(H) 103(H) 116(H)  BUN 6 - 20 mg/dL 8 9 9   Creatinine 0.44 - 1.00 mg/dL 0.61 0.68 0.64  Sodium 135 - 145 mmol/L 140 141 141  Potassium 3.5 - 5.1 mmol/L 3.4(L) 3.5 3.6  Chloride 98 - 111 mmol/L 99 101 104  CO2 22 - 32 mmol/L 32 32 31  Calcium 8.9 - 10.3 mg/dL 8.4(L) 8.2(L) 8.4(L)  Total Protein 6.5 - 8.1 g/dL - - -  Total Bilirubin 0.3 - 1.2 mg/dL - - -  Alkaline Phos 38 - 126 U/L - - -  AST 15 - 41 U/L - - -  ALT 0 - 44 U/L - - -   Imaging studies: No new pertinent imaging studies   Assessment/Plan:  57 y.o. female with perforated appendicitis status post percutaneous drainage of abscess. Patient with improved WBC count and no fever in last 24 hours. Pain has improve significantly. Diet advanced. Still need IV abx therapy. Will assess  diet toleration.    Arnold Long, MD

## 2018-02-03 ENCOUNTER — Inpatient Hospital Stay: Payer: Medicaid Other

## 2018-02-03 ENCOUNTER — Other Ambulatory Visit: Payer: Self-pay | Admitting: Surgery

## 2018-02-03 ENCOUNTER — Encounter: Payer: Self-pay | Admitting: Radiology

## 2018-02-03 DIAGNOSIS — I152 Hypertension secondary to endocrine disorders: Secondary | ICD-10-CM

## 2018-02-03 DIAGNOSIS — E1165 Type 2 diabetes mellitus with hyperglycemia: Secondary | ICD-10-CM

## 2018-02-03 DIAGNOSIS — E1159 Type 2 diabetes mellitus with other circulatory complications: Secondary | ICD-10-CM

## 2018-02-03 DIAGNOSIS — E119 Type 2 diabetes mellitus without complications: Secondary | ICD-10-CM

## 2018-02-03 DIAGNOSIS — K651 Peritoneal abscess: Secondary | ICD-10-CM

## 2018-02-03 DIAGNOSIS — I10 Essential (primary) hypertension: Secondary | ICD-10-CM

## 2018-02-03 LAB — AEROBIC/ANAEROBIC CULTURE (SURGICAL/DEEP WOUND): SPECIAL REQUESTS: NORMAL

## 2018-02-03 LAB — CBC WITH DIFFERENTIAL/PLATELET
Basophils Absolute: 0.1 10*3/uL (ref 0–0.1)
Basophils Relative: 1 %
Eosinophils Absolute: 0.4 10*3/uL (ref 0–0.7)
Eosinophils Relative: 7 %
HEMATOCRIT: 39.7 % (ref 35.0–47.0)
Hemoglobin: 13 g/dL (ref 12.0–16.0)
LYMPHS ABS: 1.3 10*3/uL (ref 1.0–3.6)
LYMPHS PCT: 22 %
MCH: 26.9 pg (ref 26.0–34.0)
MCHC: 32.8 g/dL (ref 32.0–36.0)
MCV: 82.2 fL (ref 80.0–100.0)
MONOS PCT: 13 %
Monocytes Absolute: 0.8 10*3/uL (ref 0.2–0.9)
NEUTROS ABS: 3.5 10*3/uL (ref 1.4–6.5)
NEUTROS PCT: 57 %
Platelets: 290 10*3/uL (ref 150–440)
RBC: 4.83 MIL/uL (ref 3.80–5.20)
RDW: 15 % — ABNORMAL HIGH (ref 11.5–14.5)
WBC: 6.1 10*3/uL (ref 3.6–11.0)

## 2018-02-03 LAB — AEROBIC/ANAEROBIC CULTURE W GRAM STAIN (SURGICAL/DEEP WOUND)

## 2018-02-03 LAB — PHOSPHORUS: Phosphorus: 3.9 mg/dL (ref 2.5–4.6)

## 2018-02-03 LAB — BASIC METABOLIC PANEL
Anion gap: 10 (ref 5–15)
BUN: 10 mg/dL (ref 6–20)
CHLORIDE: 98 mmol/L (ref 98–111)
CO2: 33 mmol/L — AB (ref 22–32)
CREATININE: 0.65 mg/dL (ref 0.44–1.00)
Calcium: 8.5 mg/dL — ABNORMAL LOW (ref 8.9–10.3)
GFR calc Af Amer: >60 mL/min (ref >60)
GFR calc non Af Amer: >60 mL/min (ref >60)
GLUCOSE: 102 mg/dL — AB (ref 70–99)
POTASSIUM: 3.7 mmol/L (ref 3.5–5.1)
Sodium: 141 mmol/L (ref 135–145)

## 2018-02-03 LAB — MAGNESIUM: Magnesium: 1.9 mg/dL (ref 1.7–2.4)

## 2018-02-03 LAB — GLUCOSE, CAPILLARY
GLUCOSE-CAPILLARY: 138 mg/dL — AB (ref 70–99)
Glucose-Capillary: 108 mg/dL — ABNORMAL HIGH (ref 70–99)

## 2018-02-03 MED ORDER — LEVOFLOXACIN 750 MG PO TABS: 750.0000 mg | ORAL_TABLET | Freq: Every day | ORAL | 0 refills | Status: AC

## 2018-02-03 MED ORDER — IOHEXOL 300 MG/ML  SOLN: 100.0000 mL | Freq: Once | INTRAMUSCULAR | Status: DC | PRN

## 2018-02-03 MED ORDER — DOCUSATE SODIUM 100 MG PO CAPS: 100.0000 mg | ORAL_CAPSULE | Freq: Every day | ORAL | 2 refills | Status: DC | PRN

## 2018-02-03 MED ORDER — HYDROCODONE-ACETAMINOPHEN 5-325 MG PO TABS: 1.0000 | ORAL_TABLET | Freq: Four times a day (QID) | ORAL | 0 refills | Status: DC | PRN

## 2018-02-03 MED ORDER — IOPAMIDOL (ISOVUE-300) INJECTION 61%
100.0000 mL | Freq: Once | INTRAVENOUS | Status: AC | PRN
  Administered 2018-02-03: 100 mL via INTRAVENOUS

## 2018-02-03 MED ORDER — SODIUM CHLORIDE 0.9% FLUSH: 5.0000 mL | Freq: Every day | INTRAVENOUS | 0 refills | Status: AC

## 2018-02-03 NOTE — Care Management Note (Signed)
Case Management Note  Patient Details  Name: Tracy Hood MRN: [id number] Date of Birth: 12-Oct-1960   Patient status post IR percutaneous drainage. Patient currently lives with her sister.  States that she has only lived in the area for 2 weeks, but plans to permanently reside in [place].  Patient to discharge on Levaquin and pain medication.  Patient to pick up medication at [facility].  Coupons were provided from [email or web address].  Levaquin is $30, and pain medications is $9.  Patient denies issues obtaining her medication.  Does not have PCP as she is new to the area.  Application to [facility] , and Medication Management  Provided. RNCM signing off.   Subjective/Objective:                    Action/Plan:   Expected Discharge Date:  02/03/18               Expected Discharge Plan:  [place]  In-House Referral:     Discharge planning Services  CM Consult, [facility], Medication Assistance  Post Acute Care Choice:    Choice offered to:     DME Arranged:    DME Agency:     HH Arranged:    HH Agency:     Status of Service:  Completed, signed off  If discussed at [employer], dates discussed:    Additional Comments:  Beverly Sessions, RN 02/03/2018, 2:55 PM

## 2018-02-03 NOTE — Discharge Instructions (Signed)
Keep daily log of drain output and bring to appointments.  Keep area  Covered with dressing until appointments.  Ok to shower with soap and water running over insertion site, but no soaking in baths, swimming.  - Call surgery office during normal business hours for sudden increase in pain, discharge, and/or accompanying fever.  Go to nearest urgent care/ED for afterhour care.

## 2018-02-03 NOTE — Discharge Summary (Addendum)
Physician Discharge Summary  Patient ID: Tracy Hood MRN: [id number] DOB/AGE: 55-26-1962 55 y.o.  Admit date: 01/28/2018 Discharge date: 02/03/2018  Admission Diagnoses: perforated appendicitis, HTN, DM  Discharge Diagnoses:  SAME  Discharged Condition: good  Hospital Course: Pt admitted for perforated appendicitis.  Started on zosyn and IR percutaneous drainage.  Wbc and clinical exam monitored and improvement noted throughout stay as diet advanced.  At time of discharge, toelrating diet, voiding, residual pain from IR drain placement and from appendicitis controlled with oral pain meds.  She will go home with drain until drainage less than 37ml/day.  F/u care with [facility] radiology, appt made, instructions written on discharge instructions.  levaquin for additional 7days as outpt as well.  Consults: IR  Discharge Exam: Blood pressure 138/77, pulse 64, temperature 97.6 F (36.4 C), temperature source Oral, resp. rate 18, height 5\' 7"  (1.702 m), weight 132.5 kg (292 lb), SpO2 96 %. General appearance: alert, cooperative, appears stated age, no distress and morbidly obese Resp: clear to auscultation bilaterally Cardio: regular rate and rhythm, S1, S2 normal, no murmur, click, rub or gallop GI: soft, with point tenderness along RLQ and RUQ, improved from prevouis exam.  drain site clean, with some serosanguinous discharge. .  Disposition: Discharge disposition: 01-Home or Self Care        Allergies as of 02/03/2018      Reactions   Sulfa Antibiotics    Itching       Medication List    TAKE these medications   amitriptyline 25 MG tablet Commonly known as:  ELAVIL Take 75 mg by mouth at bedtime.   amLODipine 10 MG tablet Commonly known as:  NORVASC Take 10 mg by mouth daily.   aspirin EC 81 MG tablet Take 81 mg by mouth daily.   dicyclomine 10 MG capsule Commonly known as:  BENTYL Take 10 mg by mouth 4 (four) times daily.   furosemide 40 MG tablet Commonly  known as:  LASIX Take 40 mg by mouth daily.   HYDROcodone-acetaminophen 5-325 MG tablet Commonly known as:  NORCO/VICODIN Take 1-2 tablets by mouth every 6 (six) hours as needed for up to 10 doses for moderate pain.   metFORMIN 500 MG 24 hr tablet Commonly known as:  GLUCOPHAGE-XR Take 1,000 mg by mouth daily with breakfast.   pravastatin 10 MG tablet Commonly known as:  PRAVACHOL Take 10 mg by mouth daily.   sodium chloride flush 0.9 % Soln Commonly known as:  NS 5 mLs by Intracatheter route daily for 14 days.   TRESIBA FLEXTOUCH 200 UNIT/ML Sopn Generic drug:  Insulin Degludec Inject 42 Units into the skin at bedtime.      Follow-up Information    Tracy Hood, Tracy Fleece, Tracy Hood Follow up in 2 week(s).   Specialty:  Surgery Why:  after radiology appointment Contact information: [address] [place] [address] [phone]        [facility] radiology. Go on 02/18/2018.   Why:  Appointment time is 0915.  Please Tracy Hood not eat any solid foods 4 hours prior to appt date in preparation for possible drain removal.  Call office to ask for sooner appointment if daily drainage from drain becomes less than 6ml Contact information: [place], [place]         Tiime spent coordinating discharge >30 min. Signed: Benjamine Sprague 02/03/2018, 10:03 AM

## 2018-02-04 ENCOUNTER — Other Ambulatory Visit: Payer: Self-pay

## 2018-02-05 ENCOUNTER — Ambulatory Visit
Admission: RE | Admit: 2018-02-05 | Discharge: 2018-02-05 | Disposition: A | Discharge status: Home / Self Care | Payer: No Typology Code available for payment source | Source: Ambulatory Visit | Attending: Surgery | Admitting: Surgery

## 2018-02-05 ENCOUNTER — Encounter: Payer: Self-pay | Admitting: Radiology

## 2018-02-05 DIAGNOSIS — K651 Peritoneal abscess: Secondary | ICD-10-CM

## 2018-02-05 HISTORY — PX: IR RADIOLOGIST EVAL & MGMT: IMG5224

## 2018-02-05 NOTE — Progress Notes (Signed)
Chief Complaint: Patient was seen in consultation today for  Chief Complaint  Patient presents with  . Follow-up    abscess drain follow up     at the request of Sakai,Isami  Referring Physician(s): Sakai,Isami  History of Present Illness: Tracy Hood is a 57 y.o. female with history of a perforated appendicitis.  Patient was admitted to [facility] for IV antibiotics and percutaneous drainage of the periappendiceal abscess.  Patient was discharged on 02/03/2018.  She denies fevers or chills.  Patient has been flushing the drain once a day and there has been minimal output for the last couple days.  Patient continues to have mild tenderness in the right abdomen.  CT of the abdomen on 02/03/2018 suggested that the abscess had resolved with the drain.  Patient is currently taking oral antibiotics.  Past Medical History:  Diagnosis Date  . COPD (chronic obstructive pulmonary disease) ([place])   . Diabetes mellitus without complication ([place])   . Hypertension     Past Surgical History:  Procedure Laterality Date  . IR RADIOLOGIST EVAL & MGMT  02/05/2018  . JOINT REPLACEMENT    . TUBAL LIGATION      Allergies: Sulfa antibiotics  Medications: Prior to Admission medications   Medication Sig Start Date End Date Taking? Authorizing Provider  amitriptyline (ELAVIL) 25 MG tablet Take 75 mg by mouth at bedtime.    [provider]  amLODipine (NORVASC) 10 MG tablet Take 10 mg by mouth daily.    [provider]  aspirin EC 81 MG tablet Take 81 mg by mouth daily.    [provider]  dicyclomine (BENTYL) 10 MG capsule Take 10 mg by mouth 4 (four) times daily.    [provider]  docusate sodium (COLACE) 100 MG capsule Take 1 capsule (100 mg total) by mouth daily as needed. 02/03/18 02/03/19  Lysle Pearl, Isami, DO  furosemide (LASIX) 40 MG tablet Take 40 mg by mouth daily.    [provider]  HYDROcodone-acetaminophen  (NORCO/VICODIN) 5-325 MG tablet Take 1-2 tablets by mouth every 6 (six) hours as needed for up to 10 doses for moderate pain. 02/03/18   Lysle Pearl, Isami, DO  Insulin Degludec (TRESIBA FLEXTOUCH) 200 UNIT/ML SOPN Inject 42 Units into the skin at bedtime.    [provider]  levofloxacin (LEVAQUIN) 750 MG tablet Take 1 tablet (750 mg total) by mouth daily for 7 doses. 02/03/18 02/10/18  Lysle Pearl, Isami, DO  metFORMIN (GLUCOPHAGE-XR) 500 MG 24 hr tablet Take 1,000 mg by mouth daily with breakfast.    [provider]  pravastatin (PRAVACHOL) 10 MG tablet Take 10 mg by mouth daily.    [provider]  sodium chloride flush (NS) 0.9 % SOLN 5 mLs by Intracatheter route daily for 14 days. 02/03/18 02/17/18  Benjamine Sprague, DO     No family history on file.  Social History   Socioeconomic History  . Marital status: Single    Spouse name: Not on file  . Number of children: Not on file  . Years of education: Not on file  . Highest education level: Not on file  Occupational History  . Not on file  Social Needs  . Financial resource strain: Not on file  . Food insecurity:    Worry: Not on file    Inability: Not on file  . Transportation needs:    Medical: Not on file    Non-medical: Not on file  Tobacco Use  . Smoking  status: Former Research scientist (life sciences)  . Smokeless tobacco: Never Used  Substance and Sexual Activity  . Alcohol use: Yes  . Drug use: Never  . Sexual activity: Not on file  Lifestyle  . Physical activity:    Days per week: Not on file    Minutes per session: Not on file  . Stress: Not on file  Relationships  . Social connections:    Talks on phone: Not on file    Gets together: Not on file    Attends religious service: Not on file    Active member of club or organization: Not on file    Attends meetings of clubs or organizations: Not on file    Relationship status: Not on file  Other Topics Concern  . Not on file  Social History Narrative  . Not on file       Review of Systems  Constitutional: Negative for chills and fever.  Gastrointestinal: Positive for abdominal pain.    Vital Signs: BP 121/75   Pulse 76   Temp 98.3 F (36.8 C) (Oral)   Resp 19   Ht 5\' 7"  (1.702 m)   Wt 283 lb (128.4 kg)   SpO2 95%   BMI 44.32 kg/m   Physical Exam  Constitutional: No distress.  Cardiovascular: Normal rate, regular rhythm and normal heart sounds.  Pulmonary/Chest: Effort normal and breath sounds normal.  Abdominal: Soft. Bowel sounds are normal. She exhibits no distension. There is tenderness.  Mild tenderness in the mid and right abdomen.  Percutaneous drainage catheter is intact.  Small amount of blood-tinged fluid within the suction bulb.  The drain was easily flushed with 5 cc of sterile saline.  Small amount of red fluid was able to be aspirated.  No evidence for frank purulent or stool-like material.      Imaging: Ct Abdomen Pelvis W Contrast  Result Date: 02/03/2018 CLINICAL DATA:  Perforated appendix.  With drain.  Follow-up. EXAM: CT ABDOMEN AND PELVIS WITH CONTRAST TECHNIQUE: Multidetector CT imaging of the abdomen and pelvis was performed using the standard protocol following bolus administration of intravenous contrast. CONTRAST:  132mL ISOVUE-300 IOPAMIDOL (ISOVUE-300) INJECTION 61% COMPARISON:  CT of the abdomen and pelvis on 01/30/2018 the and 01/28/2018 FINDINGS: Lower chest: There is bibasilar atelectasis. Mitral and coronary artery calcifications are present. Hepatobiliary: No focal liver abnormality is seen. No radiopaque gallstones, biliary dilatation, or pericholecystic inflammatory changes. Pancreas: Unremarkable. No pancreatic ductal dilatation or surrounding inflammatory changes. Spleen: Normal in size without focal abnormality. Adrenals/Urinary Tract: Adrenal glands are unremarkable. Kidneys are normal, without renal calculi, focal lesion, or hydronephrosis. Bladder is unremarkable. Stomach/Bowel: Ile stomach and small  bowel loops are normal in appearance. Status post appendectomy. A pigtail type catheter is identified in the RIGHT LOWER QUADRANT adjacent to the appendix. There is no residual drainable abscess. Inflammatory changes are identified in the RIGHT LOWER QUADRANT. Nonobstructing bowel gas pattern. Loops of colon are normal in appearance. Vascular/Lymphatic: The there is atherosclerotic calcification of the abdominal aorta not associated with aneurysm. Reproductive: Uterus is enlarged, extending to level of the umbilicus. Uterus measures at least 12.8 x 20.3 x 12.3 centimeters. No adnexal mass. Other: Small paraumbilical hernias contain mesenteric fat. Calcification identified in the umbilicus. There are small fat containing bilateral inguinal hernias. Musculoskeletal: No acute or significant osseous findings. IMPRESSION: 1. No drainable abscess in the RIGHT LOWER QUADRANT. 2. Persistent mild inflammatory changes in the RIGHT LOWER QUADRANT. 3. Nonobstructed bowel-gas pattern. 4. Stable enlargement of the uterus. Electronically Signed  By: Nolon Nations M.D.   On: 02/03/2018 13:02   Ct Abdomen Pelvis W Contrast  Result Date: 01/28/2018 CLINICAL DATA:  Right lower quadrant abdominal pain EXAM: CT ABDOMEN AND PELVIS WITH CONTRAST TECHNIQUE: Multidetector CT imaging of the abdomen and pelvis was performed using the standard protocol following bolus administration of intravenous contrast. CONTRAST:  133mL ISOVUE-300 IOPAMIDOL (ISOVUE-300) INJECTION 61% COMPARISON:  None. FINDINGS: Lower chest: Lung bases demonstrate no acute consolidation or pleural effusion. Borderline cardiomegaly. Mitral calcification. Small hiatal hernia Hepatobiliary: No focal liver abnormality is seen. No gallstones, gallbladder wall thickening, or biliary dilatation. Pancreas: Unremarkable. No pancreatic ductal dilatation or surrounding inflammatory changes. Spleen: Normal in size without focal abnormality. Adrenals/Urinary Tract: Adrenal  glands are unremarkable. Kidneys are normal, without renal calculi, focal lesion, or hydronephrosis. Bladder is unremarkable. Stomach/Bowel: The stomach is nonenlarged. No dilated small bowel. Enlarged inflamed appendix, measuring up to 14 mm. 3.8 cm gas and fluid collection in the right lower quadrant adjacent to the tip of the appendix. Punctate calcification in the right lower quadrant near the inflammatory process. No colon wall thickening Vascular/Lymphatic: Mild aortic atherosclerosis. No aneurysm. No significantly enlarged lymph nodes Reproductive: Markedly enlarged uterus which extends to the level of the umbilicus. No adnexal mass Other: Negative for free air. Small amount of fluid in the right lower quadrant. Small fat containing umbilical and periumbilical hernia with small calcification at the base of the umbilicus. Musculoskeletal: Degenerative changes with trace anterolisthesis L4 on L5. No acute or suspicious abnormality IMPRESSION: 1. Findings are consistent with acute perforated appendicitis. Perforation appears contained in the right lower quadrant where there is a 3.8 cm gas and fluid collection. Appendix: Location: Right lower quadrant Diameter: 14 mm Appendicolith: Punctate stone in the right lower quadrant may reflect punctate appendicoliths but appears extraluminal. Mucosal hyper-enhancement: Negative Extraluminal gas: Positive for 3.8 cm gas and fluid collection in the right lower quadrant near the tip of the appendix. Periappendiceal collection: Positive for 3.8 cm gas and fluid collection in the right lower quadrant near the tip of the appendix. 2. Massive enlargement of the uterus which extends to the level of the umbilicus, probably due to multiple large uterine fibroids. Critical Value/emergent results were called by telephone at the time of interpretation on 01/28/2018 at 8:46 pm to Dr. Rudene Re , who verbally acknowledged these results. Electronically Signed   By: Donavan Foil  M.D.   On: 01/28/2018 20:46   US Venous Img Lower Bilateral  Result Date: 01/31/2018 CLINICAL DATA:  Bilateral lower extremity pain for 4 days EXAM: BILATERAL LOWER EXTREMITY VENOUS DOPPLER ULTRASOUND TECHNIQUE: Gray-scale sonography with graded compression, as well as color Doppler and duplex ultrasound were performed to evaluate the lower extremity deep venous systems from the level of the common femoral vein and including the common femoral, femoral, profunda femoral, popliteal and calf veins including the posterior tibial, peroneal and gastrocnemius veins when visible. The superficial great saphenous vein was also interrogated. Spectral Doppler was utilized to evaluate flow at rest and with distal augmentation maneuvers in the common femoral, femoral and popliteal veins. COMPARISON:  None. FINDINGS: RIGHT LOWER EXTREMITY Common Femoral Vein: No evidence of thrombus. Normal compressibility, respiratory phasicity and response to augmentation. Saphenofemoral Junction: No evidence of thrombus. Normal compressibility and flow on color Doppler imaging. Profunda Femoral Vein: No evidence of thrombus. Normal compressibility and flow on color Doppler imaging. Femoral Vein: No evidence of thrombus. Normal compressibility, respiratory phasicity and response to augmentation. Popliteal Vein: No evidence of thrombus.  Normal compressibility, respiratory phasicity and response to augmentation. Calf Veins: No evidence of thrombus. Normal compressibility and flow on color Doppler imaging. Superficial Great Saphenous Vein: No evidence of thrombus. Normal compressibility. Venous Reflux:  None. Other Findings:  None. LEFT LOWER EXTREMITY Common Femoral Vein: No evidence of thrombus. Normal compressibility, respiratory phasicity and response to augmentation. Saphenofemoral Junction: No evidence of thrombus. Normal compressibility and flow on color Doppler imaging. Profunda Femoral Vein: No evidence of thrombus. Normal  compressibility and flow on color Doppler imaging. Femoral Vein: No evidence of thrombus. Normal compressibility, respiratory phasicity and response to augmentation. Popliteal Vein: No evidence of thrombus. Normal compressibility, respiratory phasicity and response to augmentation. Calf Veins: No evidence of thrombus. Normal compressibility and flow on color Doppler imaging. Superficial Great Saphenous Vein: No evidence of thrombus. Normal compressibility. Venous Reflux:  None. Other Findings:  None. IMPRESSION: No evidence of deep venous thrombosis. Electronically Signed   By: Jerilynn Mages.  Shick M.D.   On: 01/31/2018 11:29   Ct Image Guided Drainage By Percutaneous Catheter  Result Date: 01/30/2018 CLINICAL DATA:  Ruptured appendicitis with appendiceal abscess in the right lower quadrant by CT. Request has been made to drain abscess prior to elective appendectomy. EXAM: CT GUIDED CATHETER DRAINAGE OF APPENDICEAL ABSCESS ANESTHESIA/SEDATION: 1.5 mg IV Versed 75 mcg IV Fentanyl Total Moderate Sedation Time:  17 minutes The patient's level of consciousness and physiologic status were continuously monitored during the procedure by Radiology nursing. PROCEDURE: The procedure, risks, benefits, and alternatives were explained to the patient. Questions regarding the procedure were encouraged and answered. The patient understands and consents to the procedure. A time out was performed prior to initiating the procedure. CT was performed through the lower abdomen and pelvis in a supine position. The right lower abdominal wall was prepped with chlorhexidine in a sterile fashion, and a sterile drape was applied covering the operative field. A sterile gown and sterile gloves were used for the procedure. Local anesthesia was provided with 1% Lidocaine. Under CT guidance, an 18 gauge trocar needle was advanced into a right lower quadrant appendiceal abscess. Aspiration of fluid was performed and a sample sent for culture analysis. A  guidewire was advanced through the needle and the needle removed. The tract was dilated over the guidewire. A 10 French percutaneous drainage catheter was then advanced over the wire and formed. Catheter position was confirmed by CT. The catheter was flushed and connected to a suction bulb. The catheter was secured at the skin with a Prolene retention suture and adhesive StatLock device. COMPLICATIONS: None FINDINGS: Aspiration at the level of the right lower quadrant appendiceal abscess yielded purulent, feculent and bloody appearing fluid. The drainage catheter was able to be formed in the abscess. IMPRESSION: CT-guided percutaneous catheter drainage of right lower quadrant appendiceal abscess. A sample of purulent fluid was sent for culture analysis. A 10 French drain was placed and attached to suction bulb drainage. Electronically Signed   By: Aletta Edouard M.D.   On: 01/30/2018 14:32   Ir Radiologist Eval & Mgmt  Result Date: 02/05/2018 Please refer to notes tab for details about interventional procedure. (Op Note)   Labs:  CBC: Recent Labs    01/31/18 0558 02/01/18 0520 02/02/18 0425 02/03/18 0412  WBC 13.7* 8.7 7.6 6.1  HGB 12.7 12.7 12.7 13.0  HCT 39.3 37.9 37.8 39.7  PLT 244 273 298 290    COAGS: Recent Labs    01/30/18 0318  INR 1.08  APTT 38*    BMP:  Recent Labs    01/31/18 0558 02/01/18 0520 02/02/18 0425 02/03/18 0412  NA 141 141 140 141  K 3.6 3.5 3.4* 3.7  CL 104 101 99 98  CO2 31 32 32 33*  GLUCOSE 116* 103* 102* 102*  BUN 9 9 8 10   CALCIUM 8.4* 8.2* 8.4* 8.5*  CREATININE 0.64 0.68 0.61 0.65  GFRNONAA >60 >60 >60 >60  GFRAA >60 >60 >60 >60    LIVER FUNCTION TESTS: Recent Labs    01/28/18 1923  BILITOT 0.9  AST 14*  ALT 10  ALKPHOS 92  PROT 8.1  ALBUMIN 3.4*    TUMOR MARKERS: No results for input(s): AFPTM, CEA, CA199, CHROMGRNA in the last 8760 hours.  Assessment and Plan:  57 year old with history of perforated appendicitis that was  treated with antibiotics and percutaneous drain placement.  Follow-up CT imaging suggested that the periappendiceal abscess had resolved and no significant drainage from the catheter over the past few days.  Patient continues to have abdominal tenderness but feels much better than she did prior to her hospitalization and drain placement.  Surgery has requested removal of the percutaneous drain and this seems reasonable at this time.  The dressing and retention sutures were easily removed.  The entire drain was removed without complication.  Small bandage was placed at the drain site.  Management of the periappendiceal abscess with a percutaneous drain and antibiotics.  The abscess has resolved based on drain output and most recent CT imaging.  The drain was successfully removed.  Instructed the patient to continue her oral antibiotics and follow-up with general surgery.   A copy of this report was sent to the requesting provider on this date.  Electronically Signed: Burman Riis 02/05/2018, 11:18 AM   I spent a total of    15 Minutes in face to face in clinical consultation, greater than 50% of which was counseling/coordinating care for drain care.  Patient ID: Tracy Hood, female   DOB: 1960/11/15, 57 y.o.   MRN: [id number]

## 2018-02-17 ENCOUNTER — Other Ambulatory Visit: Payer: Self-pay

## 2018-02-18 ENCOUNTER — Other Ambulatory Visit: Payer: Self-pay

## 2018-04-09 ENCOUNTER — Other Ambulatory Visit: Payer: Self-pay

## 2018-04-09 ENCOUNTER — Encounter: Payer: Self-pay | Admitting: Physician Assistant

## 2018-04-09 ENCOUNTER — Emergency Department: Payer: Medicaid Other

## 2018-04-09 ENCOUNTER — Emergency Department
Admission: EM | Admit: 2018-04-09 | Discharge: 2018-04-09 | Disposition: A | Discharge status: Home / Self Care | Payer: Medicaid Other | Attending: Emergency Medicine | Admitting: Emergency Medicine

## 2018-04-09 DIAGNOSIS — R0981 Nasal congestion: Secondary | ICD-10-CM | POA: Diagnosis not present

## 2018-04-09 DIAGNOSIS — J219 Acute bronchiolitis, unspecified: Secondary | ICD-10-CM | POA: Insufficient documentation

## 2018-04-09 DIAGNOSIS — R0789 Other chest pain: Secondary | ICD-10-CM | POA: Insufficient documentation

## 2018-04-09 DIAGNOSIS — J449 Chronic obstructive pulmonary disease, unspecified: Secondary | ICD-10-CM | POA: Insufficient documentation

## 2018-04-09 DIAGNOSIS — I10 Essential (primary) hypertension: Secondary | ICD-10-CM | POA: Insufficient documentation

## 2018-04-09 DIAGNOSIS — Z87891 Personal history of nicotine dependence: Secondary | ICD-10-CM | POA: Diagnosis not present

## 2018-04-09 DIAGNOSIS — Z7982 Long term (current) use of aspirin: Secondary | ICD-10-CM | POA: Diagnosis not present

## 2018-04-09 DIAGNOSIS — Z794 Long term (current) use of insulin: Secondary | ICD-10-CM | POA: Insufficient documentation

## 2018-04-09 DIAGNOSIS — Z7902 Long term (current) use of antithrombotics/antiplatelets: Secondary | ICD-10-CM | POA: Insufficient documentation

## 2018-04-09 DIAGNOSIS — R05 Cough: Secondary | ICD-10-CM | POA: Diagnosis present

## 2018-04-09 DIAGNOSIS — Z79899 Other long term (current) drug therapy: Secondary | ICD-10-CM | POA: Insufficient documentation

## 2018-04-09 DIAGNOSIS — E119 Type 2 diabetes mellitus without complications: Secondary | ICD-10-CM | POA: Insufficient documentation

## 2018-04-09 MED ORDER — HYDROCOD POLST-CPM POLST ER 10-8 MG/5ML PO SUER: 5.0000 mL | Freq: Two times a day (BID) | ORAL | 0 refills | Status: DC | PRN

## 2018-04-09 MED ORDER — METHYLPREDNISOLONE 4 MG PO TBPK: ORAL_TABLET | ORAL | 0 refills | Status: DC

## 2018-04-09 MED ORDER — AZITHROMYCIN 250 MG PO TABS: ORAL_TABLET | ORAL | 0 refills | Status: DC

## 2018-04-09 MED ORDER — ALBUTEROL SULFATE HFA 108 (90 BASE) MCG/ACT IN AERS: 2.0000 | INHALATION_SPRAY | Freq: Four times a day (QID) | RESPIRATORY_TRACT | 2 refills | Status: DC | PRN

## 2018-04-09 MED ORDER — IPRATROPIUM-ALBUTEROL 0.5-2.5 (3) MG/3ML IN SOLN
3.0000 mL | Freq: Once | RESPIRATORY_TRACT | Status: AC
  Administered 2018-04-09: 3 mL via RESPIRATORY_TRACT
  Filled 2018-04-09: qty 3

## 2018-04-09 NOTE — ED Triage Notes (Signed)
Pt states she is for cough and cold like symptoms for 3 weeks.  States it is not getting better.  Denies fever except initially.  Cough- productive, clear, but foul taste.

## 2018-04-09 NOTE — ED Provider Notes (Signed)
[facility] Emergency Department Provider Note  ____________________________________________   First MD Initiated Contact with Patient 04/09/18 1349     (approximate)  I have reviewed the triage vital signs and the nursing notes.   HISTORY  Chief Complaint Cough and URI    HPI Tracy Hood is a 57 y.o. female presents emergency department complaining of cough and congestion with foul tasting.  she states she hears herself wheezing..  Chest feels tight.  Symptoms for greater than 14 days.  Denies fever, chills, chest pain or shortness of breath.   Past Medical History:  Diagnosis Date  . COPD (chronic obstructive pulmonary disease) ([place])   . Diabetes mellitus without complication ([place])   . Hypertension     Patient Active Problem List   Diagnosis Date Noted  . Diabetes ([place]) 02/03/2018  . Essential hypertension 02/03/2018  . Appendicitis with peritoneal abscess 01/28/2018    Past Surgical History:  Procedure Laterality Date  . IR RADIOLOGIST EVAL & MGMT  02/05/2018  . JOINT REPLACEMENT    . TUBAL LIGATION      Prior to Admission medications   Medication Sig Start Date End Date Taking? Authorizing Provider  albuterol (PROVENTIL HFA;VENTOLIN HFA) 108 (90 Base) MCG/ACT inhaler Inhale 2 puffs into the lungs every 6 (six) hours as needed for wheezing or shortness of breath. 04/09/18   Sky Primo, Linden Dolin, PA-C  amitriptyline (ELAVIL) 25 MG tablet Take 75 mg by mouth at bedtime.    [provider]  amLODipine (NORVASC) 10 MG tablet Take 10 mg by mouth daily.    [provider]  aspirin EC 81 MG tablet Take 81 mg by mouth daily.    [provider]  azithromycin (ZITHROMAX Z-PAK) 250 MG tablet 2 pills today then 1 pill a day for 4 days 04/09/18   Caryn Section Linden Dolin, PA-C  chlorpheniramine-HYDROcodone (TUSSIONEX PENNKINETIC ER) 10-8 MG/5ML SUER Take 5 mLs by mouth every 12 (twelve) hours as needed for cough. 04/09/18   Dynastee Brummell, Linden Dolin,  PA-C  dicyclomine (BENTYL) 10 MG capsule Take 10 mg by mouth 4 (four) times daily.    [provider]  docusate sodium (COLACE) 100 MG capsule Take 1 capsule (100 mg total) by mouth daily as needed. 02/03/18 02/03/19  Lysle Pearl, Isami, DO  furosemide (LASIX) 40 MG tablet Take 40 mg by mouth daily.    [provider]  HYDROcodone-acetaminophen (NORCO/VICODIN) 5-325 MG tablet Take 1-2 tablets by mouth every 6 (six) hours as needed for up to 10 doses for moderate pain. 02/03/18   Lysle Pearl, Isami, DO  Insulin Degludec (TRESIBA FLEXTOUCH) 200 UNIT/ML SOPN Inject 42 Units into the skin at bedtime.    [provider]  metFORMIN (GLUCOPHAGE-XR) 500 MG 24 hr tablet Take 1,000 mg by mouth daily with breakfast.    [provider]  methylPREDNISolone (MEDROL DOSEPAK) 4 MG TBPK tablet Take 6 pills on day one then decrease by 1 pill each day 04/09/18   Versie Starks, PA-C  pravastatin (PRAVACHOL) 10 MG tablet Take 10 mg by mouth daily.    [provider]    Allergies Sulfa antibiotics  No family history on file.  Social History Social History   Tobacco Use  . Smoking status: Former Research scientist (life sciences)  . Smokeless tobacco: Never Used  Substance Use Topics  . Alcohol use: Yes  . Drug use: Never    Review of Systems  Constitutional: No fever/chills Eyes: No visual changes. ENT: No sore throat. Respiratory: Positive cough  and congestion, positive wheezing Genitourinary: Negative for dysuria. Musculoskeletal: Negative for back pain. Skin: Negative for rash.    ____________________________________________   PHYSICAL EXAM:  VITAL SIGNS: ED Triage Vitals  Enc Vitals Group     BP 04/09/18 1329 (!) 159/79     Pulse --      Resp --      Temp 04/09/18 1329 99.1 F (37.3 C)     Temp Source 04/09/18 1329 Oral     SpO2 04/09/18 1329 95 %     Weight 04/09/18 1330 291 lb (132 kg)     Height 04/09/18 1330 5\' 7"  (1.702 m)     Head Circumference --      Peak Flow --       Pain Score --      Pain Loc --      Pain Edu? --      Excl. in [place]? --     Constitutional: Alert and oriented. Well appearing and in no acute distress. Eyes: Conjunctivae are normal.  Head: Atraumatic. ENT: [place] clear bilaterally Nose: No congestion/rhinnorhea. Mouth/Throat: Mucous membranes are moist.   NECK: Is supple, no lymphadenopathy is noted  cardiovascular: Normal rate, regular rhythm.  Heart sounds are normal Respiratory: Normal respiratory effort.  No retractions, lungs with diffuse wheezing GU: deferred Musculoskeletal: FROM all extremities, warm and well perfused Neurologic:  Normal speech and language.  Skin:  Skin is warm, dry and intact. No rash noted. Psychiatric: Mood and affect are normal. Speech and behavior are normal.  ____________________________________________   LABS (all labs ordered are listed, but only abnormal results are displayed)  Labs Reviewed - No data to display ____________________________________________   ____________________________________________  RADIOLOGY  Chest x-ray is negative  ____________________________________________   PROCEDURES  Procedure(s) performed: DuoNeb  Procedures    ____________________________________________   INITIAL IMPRESSION / ASSESSMENT AND PLAN / ED COURSE  Pertinent labs & imaging results that were available during my care of the patient were reviewed by me and considered in my medical decision making (see chart for details).   Patient is 57 year old female presents emergency department complaining of cough and congestion with chest tightness and wheezing for almost 3 weeks.  On physical exam patient appears nontoxic.  Lungs with diffuse wheezing.  Remainder the exam is unremarkable.  Chest x-ray is negative  DuoNeb given.  No wheezing after the DuoNeb was given.  Air movement is increased and patient states she feels better.  She was diagnosed with acute bronchitis.  Given  prescription for Z-Pak, steroid pack, albuterol inhaler, and Tussionex cough syrup.  She is to follow-up with her regular doctor if not better in 3 to 5 days.  Return emergency department worsening.  She states she understands will comply with our instructions.   she was discharged in stable condition.     As part of my medical decision making, I reviewed the following data within the [facility] notes reviewed and incorporated, Old chart reviewed, Radiograph reviewed chest x-rays negative, Notes from prior ED visits and [place] Controlled Substance Database  ____________________________________________   FINAL CLINICAL IMPRESSION(S) / ED DIAGNOSES  Final diagnoses:  Acute bronchiolitis due to unspecified organism      NEW MEDICATIONS STARTED DURING THIS VISIT:  Discharge Medication List as of 04/09/2018  2:54 PM    START taking these medications   Details  albuterol (PROVENTIL HFA;VENTOLIN HFA) 108 (90 Base) MCG/ACT inhaler Inhale 2 puffs into the lungs every 6 (six) hours as needed  for wheezing or shortness of breath., Starting Wed 04/09/2018, Normal    azithromycin (ZITHROMAX Z-PAK) 250 MG tablet 2 pills today then 1 pill a day for 4 days, Normal    chlorpheniramine-HYDROcodone (TUSSIONEX PENNKINETIC ER) 10-8 MG/5ML SUER Take 5 mLs by mouth every 12 (twelve) hours as needed for cough., Starting Wed 04/09/2018, Normal    methylPREDNISolone (MEDROL DOSEPAK) 4 MG TBPK tablet Take 6 pills on day one then decrease by 1 pill each day, Normal         Note:  This document was prepared using Dragon voice recognition software and may include unintentional dictation errors.     Versie Starks, PA-C 04/09/18 1603    Earleen Newport, MD 04/10/18 [phone]

## 2018-04-09 NOTE — Discharge Instructions (Addendum)
Follow-up with your regular doctor or the acute care if not better in 3 to 5 days.  Return to the emergency department if you are worsening.  Take the medications as prescribed.  Test next will make you very drowsy.  Do not operate any machinery for 12 hours after taking a dose of Tussionex.

## 2018-04-09 NOTE — ED Notes (Signed)
See triage note  Presents with cough for about 3 weeks  Started like "cold" states cough is prod  No fever

## 2018-07-15 ENCOUNTER — Other Ambulatory Visit: Payer: Self-pay | Admitting: Family

## 2018-07-15 DIAGNOSIS — Z1231 Encounter for screening mammogram for malignant neoplasm of breast: Secondary | ICD-10-CM

## 2018-08-26 ENCOUNTER — Ambulatory Visit
Admission: RE | Admit: 2018-08-26 | Discharge: 2018-08-26 | Disposition: A | Discharge status: Home / Self Care | Payer: Medicaid Other | Source: Ambulatory Visit | Attending: Family | Admitting: Family

## 2018-08-26 DIAGNOSIS — Z1231 Encounter for screening mammogram for malignant neoplasm of breast: Secondary | ICD-10-CM | POA: Diagnosis not present

## 2018-10-06 ENCOUNTER — Ambulatory Visit: Admission: RE | Admit: 2018-10-06 | Payer: Medicaid Other | Source: Home / Self Care | Admitting: Gastroenterology

## 2018-10-06 ENCOUNTER — Encounter: Admission: RE | Payer: Self-pay | Source: Home / Self Care

## 2018-10-06 SURGERY — COLONOSCOPY WITH PROPOFOL
Anesthesia: General

## 2018-10-07 ENCOUNTER — Other Ambulatory Visit: Payer: Medicaid Other

## 2018-10-21 ENCOUNTER — Ambulatory Visit: Admit: 2018-10-21 | Payer: Medicaid Other | Admitting: Surgery

## 2018-10-21 SURGERY — APPENDECTOMY, LAPAROSCOPIC
Anesthesia: General

## 2018-11-28 ENCOUNTER — Other Ambulatory Visit
Admission: RE | Admit: 2018-11-28 | Payer: Medicaid Other | Source: Ambulatory Visit | Attending: Emergency Medicine | Admitting: Emergency Medicine

## 2018-12-02 ENCOUNTER — Other Ambulatory Visit
Admission: RE | Admit: 2018-12-02 | Discharge: 2018-12-02 | Disposition: A | Discharge status: Home / Self Care | Payer: Medicaid Other | Source: Ambulatory Visit | Attending: Gastroenterology | Admitting: Gastroenterology

## 2018-12-02 ENCOUNTER — Other Ambulatory Visit: Payer: Self-pay

## 2018-12-02 DIAGNOSIS — Z1159 Encounter for screening for other viral diseases: Secondary | ICD-10-CM | POA: Diagnosis present

## 2018-12-02 LAB — SARS CORONAVIRUS 2 BY RT PCR (HOSPITAL ORDER, PERFORMED IN ~~LOC~~ HOSPITAL LAB): SARS Coronavirus 2: NEGATIVE

## 2018-12-04 ENCOUNTER — Encounter: Payer: Self-pay | Admitting: *Deleted

## 2018-12-05 ENCOUNTER — Other Ambulatory Visit: Payer: Self-pay

## 2018-12-05 ENCOUNTER — Ambulatory Visit: Payer: Medicaid Other | Admitting: Certified Registered"

## 2018-12-05 ENCOUNTER — Ambulatory Visit
Admission: RE | Admit: 2018-12-05 | Discharge: 2018-12-05 | Disposition: A | Discharge status: Home / Self Care | Payer: Medicaid Other | Attending: Gastroenterology | Admitting: Gastroenterology

## 2018-12-05 ENCOUNTER — Encounter
Admission: RE | Disposition: A | Discharge status: Home / Self Care | Payer: Self-pay | Source: Home / Self Care | Attending: Gastroenterology

## 2018-12-05 ENCOUNTER — Encounter: Payer: Self-pay | Admitting: Anesthesiology

## 2018-12-05 DIAGNOSIS — J449 Chronic obstructive pulmonary disease, unspecified: Secondary | ICD-10-CM | POA: Insufficient documentation

## 2018-12-05 DIAGNOSIS — Z794 Long term (current) use of insulin: Secondary | ICD-10-CM | POA: Insufficient documentation

## 2018-12-05 DIAGNOSIS — Z800 Family history of malignant neoplasm of digestive organs: Secondary | ICD-10-CM | POA: Insufficient documentation

## 2018-12-05 DIAGNOSIS — Z79899 Other long term (current) drug therapy: Secondary | ICD-10-CM | POA: Insufficient documentation

## 2018-12-05 DIAGNOSIS — K529 Noninfective gastroenteritis and colitis, unspecified: Secondary | ICD-10-CM | POA: Diagnosis present

## 2018-12-05 DIAGNOSIS — K5730 Diverticulosis of large intestine without perforation or abscess without bleeding: Secondary | ICD-10-CM | POA: Insufficient documentation

## 2018-12-05 DIAGNOSIS — Z7982 Long term (current) use of aspirin: Secondary | ICD-10-CM | POA: Insufficient documentation

## 2018-12-05 DIAGNOSIS — Z882 Allergy status to sulfonamides status: Secondary | ICD-10-CM | POA: Diagnosis not present

## 2018-12-05 DIAGNOSIS — I10 Essential (primary) hypertension: Secondary | ICD-10-CM | POA: Diagnosis not present

## 2018-12-05 DIAGNOSIS — R233 Spontaneous ecchymoses: Secondary | ICD-10-CM | POA: Diagnosis not present

## 2018-12-05 DIAGNOSIS — G4730 Sleep apnea, unspecified: Secondary | ICD-10-CM | POA: Insufficient documentation

## 2018-12-05 DIAGNOSIS — Z7951 Long term (current) use of inhaled steroids: Secondary | ICD-10-CM | POA: Insufficient documentation

## 2018-12-05 DIAGNOSIS — K648 Other hemorrhoids: Secondary | ICD-10-CM | POA: Insufficient documentation

## 2018-12-05 DIAGNOSIS — E119 Type 2 diabetes mellitus without complications: Secondary | ICD-10-CM | POA: Insufficient documentation

## 2018-12-05 HISTORY — DX: Sleep apnea, unspecified: G47.30

## 2018-12-05 HISTORY — PX: COLONOSCOPY WITH PROPOFOL: SHX5780

## 2018-12-05 HISTORY — DX: Irritable bowel syndrome, unspecified: K58.9

## 2018-12-05 LAB — HM COLONOSCOPY

## 2018-12-05 LAB — GLUCOSE, CAPILLARY: Glucose-Capillary: 130 mg/dL — ABNORMAL HIGH (ref 70–99)

## 2018-12-05 SURGERY — COLONOSCOPY WITH PROPOFOL
Anesthesia: General

## 2018-12-05 MED ORDER — SODIUM CHLORIDE 0.9 % IV SOLN
INTRAVENOUS | Status: DC
  Administered 2018-12-05: 12:00:00 via INTRAVENOUS

## 2018-12-05 MED ORDER — PROPOFOL 10 MG/ML IV BOLUS
INTRAVENOUS | Status: DC | PRN
  Administered 2018-12-05 (×6): 50 mg via INTRAVENOUS

## 2018-12-05 MED ORDER — SODIUM CHLORIDE 0.9 % IV SOLN: INTRAVENOUS | Status: DC

## 2018-12-05 NOTE — Anesthesia Preprocedure Evaluation (Signed)
Anesthesia Evaluation  Patient identified by MRN, date of birth, ID band Patient awake    Reviewed: Allergy & Precautions, NPO status , Patient's Chart, lab work & pertinent test results  History of Anesthesia Complications Negative for: history of anesthetic complications  Airway Mallampati: II  TM Distance: >3 FB Neck ROM: Full    Dental  (+) Poor Dentition, Missing,    Pulmonary sleep apnea, Continuous Positive Airway Pressure Ventilation and Oxygen sleep apnea , COPD, former smoker,    breath sounds clear to auscultation- rhonchi (-) wheezing      Cardiovascular hypertension, Pt. on medications (-) CAD, (-) Past MI, (-) Cardiac Stents and (-) CABG  Rhythm:Regular Rate:Normal - Systolic murmurs and - Diastolic murmurs    Neuro/Psych neg Seizures negative neurological ROS  negative psych ROS   GI/Hepatic negative GI ROS, Neg liver ROS,   Endo/Other  diabetes, Oral Hypoglycemic Agents  Renal/GU negative Renal ROS     Musculoskeletal negative musculoskeletal ROS (+)   Abdominal (+) + obese,   Peds  Hematology negative hematology ROS (+)   Anesthesia Other Findings Past Medical History: No date: COPD (chronic obstructive pulmonary disease) (HCC) No date: Diabetes mellitus without complication (HCC) No date: Hypertension No date: IBS (irritable bowel syndrome) No date: Sleep apnea   Reproductive/Obstetrics                             Anesthesia Physical Anesthesia Plan  ASA: III  Anesthesia Plan: General   Post-op Pain Management:    Induction: Intravenous  PONV Risk Score and Plan: 2 and Propofol infusion  Airway Management Planned: Natural Airway  Additional Equipment:   Intra-op Plan:   Post-operative Plan:   Informed Consent: I have reviewed the patients History and Physical, chart, labs and discussed the procedure including the risks, benefits and alternatives for the  proposed anesthesia with the patient or authorized representative who has indicated his/her understanding and acceptance.     Dental advisory given  Plan Discussed with: CRNA and Anesthesiologist  Anesthesia Plan Comments:         Anesthesia Quick Evaluation

## 2018-12-05 NOTE — H&P (Signed)
Outpatient short stay form Pre-procedure 12/05/2018 12:07 PM Lollie Sails MD  Primary Physician: Dr. Lamonte Sakai, [facility].  Dr. Lisabeth Pick, [facility] clinic  Reason for visit: Colonoscopy  History of present illness: Patient is a 58 year old female presenting today for colonoscopy.  She has a history of a perforated appendix about 5 months ago.  Was treated with antibiotics and local drainage.  She is due to go back to see her surgeon in regards to definitive procedure.  Has been having some loose stools.  She has never had a colonoscopy before.  She has a family history of colon cancer in a primary relative, sister.  Is been felt that she has problems with diarrhea related to irritable bowel.  Seen no blood in her stools.  She tolerated her prep well.  Takes no aspirin or blood thinning agent with the exception of 81 mg aspirin.    Current Facility-Administered Medications:  .  0.9 %  sodium chloride infusion, , Intravenous, Continuous, Lollie Sails, MD .  0.9 %  sodium chloride infusion, , Intravenous, Continuous, Lollie Sails, MD .  0.9 %  sodium chloride infusion, , Intravenous, Continuous, Lollie Sails, MD  Medications Prior to Admission  Medication Sig Dispense Refill Last Dose  . albuterol (PROVENTIL HFA;VENTOLIN HFA) 108 (90 Base) MCG/ACT inhaler Inhale 2 puffs into the lungs every 6 (six) hours as needed for wheezing or shortness of breath. 1 Inhaler 2 12/04/2018 at Unknown time  . amitriptyline (ELAVIL) 25 MG tablet Take 75 mg by mouth at bedtime.   Past Week at Unknown time  . amLODipine (NORVASC) 10 MG tablet Take 10 mg by mouth daily.   Past Week at Unknown time  . aspirin EC 81 MG tablet Take 81 mg by mouth daily.   Past Week at Unknown time  . budesonide-formoterol (SYMBICORT) 160-4.5 MCG/ACT inhaler Inhale 2 puffs into the lungs 2 (two) times daily.   Past Week at Unknown time  . canagliflozin (INVOKANA) 100 MG TABS tablet Take by mouth daily.    Past Week at Unknown time  . Cholecalciferol 125 MCG (5000 UT) capsule Take 5,000 Units by mouth once a week.   Past Week at Unknown time  . dicyclomine (BENTYL) 10 MG capsule Take 10 mg by mouth 4 (four) times daily.   Past Week at Unknown time  . docusate sodium (COLACE) 100 MG capsule Take 1 capsule (100 mg total) by mouth daily as needed. 30 capsule 2 Past Week at Unknown time  . furosemide (LASIX) 40 MG tablet Take 40 mg by mouth daily.   Past Week at Unknown time  . HYDROcodone-acetaminophen (NORCO/VICODIN) 5-325 MG tablet Take 1-2 tablets by mouth every 6 (six) hours as needed for up to 10 doses for moderate pain. 10 tablet 0 Past Week at Unknown time  . Insulin Degludec (TRESIBA FLEXTOUCH) 200 UNIT/ML SOPN Inject 42 Units into the skin at bedtime.   Past Week at Unknown time  . metFORMIN (GLUCOPHAGE-XR) 500 MG 24 hr tablet Take 1,000 mg by mouth daily with breakfast.   Past Week at Unknown time  . methylPREDNISolone (MEDROL DOSEPAK) 4 MG TBPK tablet Take 6 pills on day one then decrease by 1 pill each day 21 tablet 0 Past Week at Unknown time  . pravastatin (PRAVACHOL) 10 MG tablet Take 10 mg by mouth daily.   Past Week at Unknown time  . rosuvastatin (CRESTOR) 20 MG tablet Take 20 mg by mouth daily.   Past Week  at Unknown time  . azithromycin (ZITHROMAX Z-PAK) 250 MG tablet 2 pills today then 1 pill a day for 4 days 6 each 0   . chlorpheniramine-HYDROcodone (TUSSIONEX PENNKINETIC ER) 10-8 MG/5ML SUER Take 5 mLs by mouth every 12 (twelve) hours as needed for cough. 140 mL 0      Allergies  Allergen Reactions  . Sulfa Antibiotics     Itching      Past Medical History:  Diagnosis Date  . COPD (chronic obstructive pulmonary disease) ([place])   . Diabetes mellitus without complication ([place])   . Hypertension   . IBS (irritable bowel syndrome)   . Sleep apnea     Review of systems:      Physical Exam    Heart and lungs: Regular rate and rhythm without rub or gallop, lungs are  bilaterally clear.    HEENT: Normocephalic atraumatic eyes are anicteric    Other:    Pertinant exam for procedure: Soft nontender nondistended bowel sounds positive normoactive, obese.    Planned proceedures: Colonoscopy and indicated procedures. I have discussed the risks benefits and complications of procedures to include not limited to bleeding, infection, perforation and the risk of sedation and the patient wishes to proceed.    Lollie Sails, MD Gastroenterology 12/05/2018  12:07 PM

## 2018-12-05 NOTE — Op Note (Signed)
[facility] Gastroenterology Patient Name: Tracy Hood Procedure Date: 12/05/2018 11:57 AM MRN: [id number] Account #: [id number] Date of Birth: 06-Apr-1961 Admit Type: Outpatient Age: 58 Room: [facility] ENDO ROOM 3 Gender: Female Note Status: Finalized Procedure:            Colonoscopy Indications:          Chronic diarrhea, Family history of colon cancer in a                        first-degree relative before age 75 years Providers:            Lollie Sails, MD Referring MD:         Perrin Maltese, MD (Referring MD) Medicines:            Monitored Anesthesia Care Complications:        No immediate complications. Procedure:            Pre-Anesthesia Assessment:                       - ASA Grade Assessment: III - A patient with severe                        systemic disease.                       After obtaining informed consent, the colonoscope was                        passed under direct vision. Throughout the procedure,                        the patient's blood pressure, pulse, and oxygen                        saturations were monitored continuously. The                        Colonoscope was introduced through the anus and                        advanced to the the cecum, identified by appendiceal                        orifice and ileocecal valve. The colonoscopy was                        performed without difficulty. The patient tolerated the                        procedure well. The quality of the bowel preparation                        was good. Findings:      Multiple petechiae were found in the rectum, in the sigmoid colon, in       the descending colon, in the transverse colon and in the ascending colon       without mucosal defect or overt edema. This was biopsied with a cold       forceps for evaluation of microscopic colitis right and left colon and       transverse  colon.      Multiple small to medium-mouthed diverticula were found in  the sigmoid       colon and descending colon.      The cecum and appendiceal orifice appeared normal.      Non-bleeding internal hemorrhoids were found during retroflexion and       during anoscopy. The hemorrhoids were medium-sized.      The digital rectal exam was normal otherwise.      A single tiny localized angioectasia without bleeding was found in the       cecum. Impression:           - Petechia(e) in the rectum, in the sigmoid colon, in                        the descending colon, in the transverse colon and in                        the ascending colon. Biopsied.                       - Diverticulosis in the sigmoid colon and in the                        descending colon.                       - Non-bleeding internal hemorrhoids. Recommendation:       - Discharge patient to home.                       - Soft diet today, then advance as tolerated to advance                        diet as tolerated. Procedure Code(s):    --- Professional ---                       [phone], Colonoscopy, flexible; with biopsy, single or                        multiple Diagnosis Code(s):    --- Professional ---                       K63.89, Other specified diseases of intestine                       K64.8, Other hemorrhoids                       K52.9, Noninfective gastroenteritis and colitis,                        unspecified                       Z80.0, Family history of malignant neoplasm of                        digestive organs                       K57.30, Diverticulosis of large intestine without  perforation or abscess without bleeding CPT copyright 2019 American Medical Association. All rights reserved. The codes documented in this report are preliminary and upon coder review may  be revised to meet current compliance requirements. Lollie Sails, MD 12/05/2018 12:43:21 PM This report has been signed electronically. Number of Addenda: 0 Note Initiated On:  12/05/2018 11:57 AM Scope Withdrawal Time: 0 hours 6 minutes 32 seconds  Total Procedure Duration: 0 hours 11 minutes 40 seconds       [facility]

## 2018-12-05 NOTE — Transfer of Care (Signed)
Immediate Anesthesia Transfer of Care Note  Patient: Tracy Hood  Procedure(s) Performed: COLONOSCOPY WITH PROPOFOL (N/A )  Patient Location: Endoscopy Unit  Anesthesia Type:General  Level of Consciousness: awake, alert , oriented and patient cooperative  Airway & Oxygen Therapy: Patient Spontanous Breathing and Patient connected to face mask oxygen  Post-op Assessment: Report given to RN and Post -op Vital signs reviewed and stable  Post vital signs: Reviewed and stable  Last Vitals:  Vitals Value Taken Time  BP    Temp    Pulse 154 12/05/2018 12:42 PM  Resp 20 12/05/2018 12:42 PM  SpO2 98 % 12/05/2018 12:42 PM  Vitals shown include unvalidated device data.  Last Pain:  Vitals:   12/05/18 1145  TempSrc: Oral  PainSc: 0-No pain         Complications: No apparent anesthesia complications

## 2018-12-05 NOTE — Anesthesia Post-op Follow-up Note (Signed)
Anesthesia QCDR form completed.        

## 2018-12-05 NOTE — Anesthesia Postprocedure Evaluation (Signed)
Anesthesia Post Note  Patient: Tracy Hood  Procedure(s) Performed: COLONOSCOPY WITH PROPOFOL (N/A )  Patient location during evaluation: Endoscopy Anesthesia Type: General Level of consciousness: awake and alert and oriented Pain management: pain level controlled Vital Signs Assessment: post-procedure vital signs reviewed and stable Respiratory status: spontaneous breathing, nonlabored ventilation and respiratory function stable Cardiovascular status: blood pressure returned to baseline and stable Postop Assessment: no signs of nausea or vomiting Anesthetic complications: no     Last Vitals:  Vitals:   12/05/18 1255 12/05/18 1305  BP: (!) 149/89 (!) 170/99  Pulse:    Resp:    Temp:    SpO2:      Last Pain:  Vitals:   12/05/18 1245  TempSrc: Tympanic  PainSc:                  Desean Heemstra

## 2018-12-08 ENCOUNTER — Encounter: Payer: Self-pay | Admitting: Gastroenterology

## 2018-12-09 LAB — SURGICAL PATHOLOGY

## 2019-01-02 ENCOUNTER — Other Ambulatory Visit: Payer: Self-pay

## 2019-01-02 ENCOUNTER — Encounter
Admission: RE | Admit: 2019-01-02 | Discharge: 2019-01-02 | Disposition: A | Discharge status: Home / Self Care | Payer: Medicaid Other | Source: Ambulatory Visit | Attending: Surgery | Admitting: Surgery

## 2019-01-02 ENCOUNTER — Ambulatory Visit: Payer: Self-pay | Admitting: Surgery

## 2019-01-02 DIAGNOSIS — Z1159 Encounter for screening for other viral diseases: Secondary | ICD-10-CM | POA: Diagnosis not present

## 2019-01-02 DIAGNOSIS — Z01818 Encounter for other preprocedural examination: Secondary | ICD-10-CM | POA: Insufficient documentation

## 2019-01-02 HISTORY — DX: Obesity, unspecified: E66.9

## 2019-01-02 HISTORY — DX: Dyspnea, unspecified: R06.00

## 2019-01-02 HISTORY — DX: Unspecified osteoarthritis, unspecified site: M19.90

## 2019-01-02 HISTORY — DX: Anxiety disorder, unspecified: F41.9

## 2019-01-02 LAB — CBC WITH DIFFERENTIAL/PLATELET
Abs Immature Granulocytes: 0.03 10*3/uL (ref 0.00–0.07)
Basophils Absolute: 0.1 10*3/uL (ref 0.0–0.1)
Basophils Relative: 1 %
Eosinophils Absolute: 0.3 10*3/uL (ref 0.0–0.5)
Eosinophils Relative: 4 %
HCT: 44.6 % (ref 36.0–46.0)
Hemoglobin: 14.5 g/dL (ref 12.0–15.0)
Immature Granulocytes: 0 %
Lymphocytes Relative: 26 %
Lymphs Abs: 2 10*3/uL (ref 0.7–4.0)
MCH: 27.2 pg (ref 26.0–34.0)
MCHC: 32.5 g/dL (ref 30.0–36.0)
MCV: 83.5 fL (ref 80.0–100.0)
Monocytes Absolute: 0.5 10*3/uL (ref 0.1–1.0)
Monocytes Relative: 6 %
Neutro Abs: 4.8 10*3/uL (ref 1.7–7.7)
Neutrophils Relative %: 63 %
Platelets: 224 10*3/uL (ref 150–400)
RBC: 5.34 MIL/uL — ABNORMAL HIGH (ref 3.87–5.11)
RDW: 14.9 % (ref 11.5–15.5)
WBC: 7.7 10*3/uL (ref 4.0–10.5)
nRBC: 0 % (ref 0.0–0.2)

## 2019-01-02 LAB — BASIC METABOLIC PANEL
Anion gap: 9 (ref 5–15)
BUN: 7 mg/dL (ref 6–20)
CO2: 26 mmol/L (ref 22–32)
Calcium: 9 mg/dL (ref 8.9–10.3)
Chloride: 106 mmol/L (ref 98–111)
Creatinine, Ser: 0.54 mg/dL (ref 0.44–1.00)
GFR calc Af Amer: >60 mL/min (ref >60)
GFR calc non Af Amer: >60 mL/min (ref >60)
Glucose, Bld: 107 mg/dL — ABNORMAL HIGH (ref 70–99)
Potassium: 3.6 mmol/L (ref 3.5–5.1)
Sodium: 141 mmol/L (ref 135–145)

## 2019-01-02 NOTE — H&P (Signed)
Subjective:   CC: Chronic appendicitis [K36]   HPI: Tracy Hood is a 58 y.o. female who is here for followup for above. No issues since last visit  Current Medications: has a current medication list which includes the following prescription(s): amitriptyline, amlodipine, aspirin, cholecalciferol, dicyclomine, docusate, empagliflozin, furosemide, metformin, proair hfa, rosuvastatin, symbicort, and invokana.  Allergies:  Allergies  Allergen Reactions  . Sulfa (Sulfonamide Antibiotics) Itching  Itching   ROS: A 15 point review of systems was performed and pertinent positives and negatives noted in HPI  Objective:    BP (!) 168/90 (BP Location: Left upper arm, Patient Position: Sitting, BP Cuff Size: Large Adult)  Pulse 79  Ht 170.2 cm (5\' 7" )  Wt (!) 128.4 kg (283 lb)  SpO2 94%  BMI 44.32 kg/m   Constitutional : alert, appears stated age, cooperative and no distress  Lymphatics/Throat: no asymmetry, masses, or scars  Respiratory: clear to auscultation bilaterally  Cardiovascular: regular rate and rhythm  Gastrointestinal: soft, non-tender; bowel sounds normal; no masses, no organomegaly.  Musculoskeletal: Steady gait and movement  Skin: Cool and moist.  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 S/p IR drainage for perforated appendicitis. Acute issue resolved. No recurrent episodes as of yet. We again discussed risk of recurrent appendicitis.  The risk of surgery includes, but not limited to, recurrence, bleeding, chronic pain, post-op infxn, post-op SBO or ileus, hernias, resection of bowel, re-anastamosis, possible ostomy placement and need for re-operation to address said risks. The risks of general anesthetic, if used, includes MI, CVA, sudden death or even reaction to anesthetic medications also discussed. Alternatives include continued observation. Benefits  include possible symptom relief, preventing further decline in health and possible death.  Typical post-op recovery time of additional days in hospital for observation afterwards also discussed.  Patient verbalized understanding and still wishes to proceed with interval appendectomy after screening colonoscopy.

## 2019-01-02 NOTE — Patient Instructions (Signed)
INSTRUCTIONS FOR SURGERY     Your surgery is scheduled for:   Tuesday, January 06, 2019     To find out your arrival time for the day of surgery,          please call [phone] between 1 pm and 3 pm on :  Monday, January 05, 2019     When you arrive for surgery, report to the [facility].       Do NOT stop on the first floor to register.    REMEMBER: Instructions that are not followed completely may result in serious medical risk,  up to and including death, or upon the discretion of your surgeon and anesthesiologist,            your surgery may need to be rescheduled.  __X__ 1. Do not eat food after midnight the night before your procedure.                    No gum, candy, lozenger, tic tacs, tums or hard candies.                  ABSOLUTELY NOTHING SOLID IN YOUR MOUTH AFTER MIDNIGHT                    You may drink unlimited clear liquids up to 2 hours before you are scheduled to arrive for surgery.                   Do not drink anything within those 2 hours unless you need to take medicine, then take the                   smallest amount you need.  Clear liquids include:  water, apple juice without pulp,                   any flavor Gatorade, Black coffee, black tea.  Sugar may be added but no dairy/ honey /lemon.                        Broth and jello is not considered a clear liquid.  __x__  2. On the morning of surgery, please brush your teeth with toothpaste and water. You may rinse with                  mouthwash if you wish but DO NOT SWALLOW TOOTHPASTE OR MOUTHWASH  __X___3. NO alcohol for 24 hours before or after surgery.  __x___ 4.  Do NOT smoke or use e-cigarettes for 24 HOURS PRIOR TO SURGERY.                      DO NOT Use any chewable tobacco products for at least 6 hours prior to surgery.  __x___ 5. If you start any new medication after this appointment and prior to surgery, please          Bring it with you on the day of surgery.  ___x__ 6. Notify your doctor if there is any change in your medical condition, such as fever, infection, vomitting,  Diarrhea or any open sores.  __x___ 7.  USE the CHG SOAP as instructed, the night before surgery and the day of surgery.                   Once you have washed with this soap, do NOT use any of the following: Powders, perfumes                    or lotions. Please do not wear make up, hairpins, clips or nail polish. You MAY wear deodorant.                   Men may shave their face and neck.  Women need to shave 48 hours prior to surgery.                   DO NOT wear ANY jewelry on the day of surgery. If there are rings that are too tight to                    remove easily, please address this prior to the surgery day. Piercings need to be removed.                                                                     NO METAL ON YOUR BODY.                    Do NOT bring any valuables.  If you came to Pre-Admit testing then you will not need license,                     insurance card or credit card.  If you will be staying overnight, please either leave your things in                     the car or have your family be responsible for these items.                      IS NOT RESPONSIBLE FOR BELONGINGS OR VALUABLES.  ___X__ 8. DO NOT wear contact lenses on surgery day.  You may not have dentures,                     Hearing aides, contacts or glasses in the operating room. These items can be                    Placed in the Recovery Room to receive immediately after surgery.  __x___ 9. IF YOU ARE SCHEDULED TO GO HOME ON THE SAME DAY, YOU MUST                   Have someone to drive you home and to stay with you  for the first 24 hours.                    Have an arrangement prior to arriving on surgery day.  ___x__ 10. Take the following medications on the morning of surgery with a sip of water:  1. AMLODIPINE                     2. CRESTOR                     3. SYMBICORT                     4.                     5.                     6.  _____ 11.  Follow any instructions provided to you by your surgeon.                        Such as enema, clear liquid bowel prep  __X__  12. STOP COUMADIN / PLAVIX / ELIQUIS / ASPIRIN AS OF: TODAY                       THIS INCLUDES BC POWDERS / GOODIES POWDER  __x___ 13. STOP Anti-inflammatories as of: TODAY                      This includes IBUPROFEN / MOTRIN / ADVIL / ALEVE/ NAPROXYN                    YOU MAY TAKE TYLENOL ANY TIME PRIOR TO SURGERY.  _____ 14.  Stop supplements until after surgery.                     This includes: N/A                 You may continue taking Vitamin B12 / Vitamin D3 but do not take on the morning of surgery.  __X___ 15. Bring your CPAP machine into preop with you on the morning of surgery.  __X____16.  Stop Metformin 2 full days prior to surgery.  Stop on: June 27th,  saturday                     CONTINUE TAKING JARDIANCE BUT DO NOT TAKE ON SURGERY DAY                     Do NOT take any diabetes medications on surgery day.  ___X___17.  Continue to take the following medications but do not take on the morning of surgery:                            BENTYL / MULTIVITS / JARDIANCE / ELAVIL  __X____18. If staying overnight, please have appropriate shoes to wear to be able to walk around the unit.                   Wear clean and comfortable clothing to the hospital.

## 2019-01-02 NOTE — H&P (View-Only) (Signed)
Subjective:   CC: Chronic appendicitis [K36]   HPI: Tracy Hood is a 58 y.o. female who is here for followup for above. No issues since last visit  Current Medications: has a current medication list which includes the following prescription(s): amitriptyline, amlodipine, aspirin, cholecalciferol, dicyclomine, docusate, empagliflozin, furosemide, metformin, proair hfa, rosuvastatin, symbicort, and invokana.  Allergies:  Allergies  Allergen Reactions  . Sulfa (Sulfonamide Antibiotics) Itching  Itching   ROS: A 15 point review of systems was performed and pertinent positives and negatives noted in HPI  Objective:    BP (!) 168/90 (BP Location: Left upper arm, Patient Position: Sitting, BP Cuff Size: Large Adult)  Pulse 79  Ht 170.2 cm (5\' 7" )  Wt (!) 128.4 kg (283 lb)  SpO2 94%  BMI 44.32 kg/m   Constitutional : alert, appears stated age, cooperative and no distress  Lymphatics/Throat: no asymmetry, masses, or scars  Respiratory: clear to auscultation bilaterally  Cardiovascular: regular rate and rhythm  Gastrointestinal: soft, non-tender; bowel sounds normal; no masses, no organomegaly.  Musculoskeletal: Steady gait and movement  Skin: Cool and moist.  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 S/p IR drainage for perforated appendicitis. Acute issue resolved. No recurrent episodes as of yet. We again discussed risk of recurrent appendicitis.  The risk of surgery includes, but not limited to, recurrence, bleeding, chronic pain, post-op infxn, post-op SBO or ileus, hernias, resection of bowel, re-anastamosis, possible ostomy placement and need for re-operation to address said risks. The risks of general anesthetic, if used, includes MI, CVA, sudden death or even reaction to anesthetic medications also discussed. Alternatives include continued observation. Benefits  include possible symptom relief, preventing further decline in health and possible death.  Typical post-op recovery time of additional days in hospital for observation afterwards also discussed.  Patient verbalized understanding and still wishes to proceed with interval appendectomy after screening colonoscopy.

## 2019-01-03 LAB — NOVEL CORONAVIRUS, NAA (HOSP ORDER, SEND-OUT TO REF LAB; TAT 18-24 HRS): SARS-CoV-2, NAA: NOT DETECTED

## 2019-01-05 ENCOUNTER — Encounter: Payer: Self-pay | Admitting: Anesthesiology

## 2019-01-05 MED ORDER — CEFAZOLIN SODIUM-DEXTROSE 2-4 GM/100ML-% IV SOLN
2.0000 g | INTRAVENOUS | Status: AC
  Administered 2019-01-06: 3 g via INTRAVENOUS

## 2019-01-06 ENCOUNTER — Ambulatory Visit: Payer: Medicaid Other | Admitting: Anesthesiology

## 2019-01-06 ENCOUNTER — Other Ambulatory Visit: Payer: Self-pay

## 2019-01-06 ENCOUNTER — Ambulatory Visit
Admission: RE | Admit: 2019-01-06 | Discharge: 2019-01-06 | Disposition: A | Discharge status: Home / Self Care | Payer: Medicaid Other | Attending: Surgery | Admitting: Surgery

## 2019-01-06 ENCOUNTER — Encounter
Admission: RE | Disposition: A | Discharge status: Home / Self Care | Payer: Self-pay | Source: Home / Self Care | Attending: Surgery

## 2019-01-06 ENCOUNTER — Encounter: Payer: Self-pay | Admitting: *Deleted

## 2019-01-06 DIAGNOSIS — K3532 Acute appendicitis with perforation and localized peritonitis, without abscess: Secondary | ICD-10-CM | POA: Insufficient documentation

## 2019-01-06 DIAGNOSIS — K3533 Acute appendicitis with perforation and localized peritonitis, with abscess: Secondary | ICD-10-CM

## 2019-01-06 DIAGNOSIS — K36 Other appendicitis: Secondary | ICD-10-CM | POA: Diagnosis present

## 2019-01-06 DIAGNOSIS — Z7984 Long term (current) use of oral hypoglycemic drugs: Secondary | ICD-10-CM | POA: Insufficient documentation

## 2019-01-06 DIAGNOSIS — Z79899 Other long term (current) drug therapy: Secondary | ICD-10-CM | POA: Insufficient documentation

## 2019-01-06 DIAGNOSIS — Z7951 Long term (current) use of inhaled steroids: Secondary | ICD-10-CM | POA: Insufficient documentation

## 2019-01-06 DIAGNOSIS — Z7982 Long term (current) use of aspirin: Secondary | ICD-10-CM | POA: Diagnosis not present

## 2019-01-06 DIAGNOSIS — Z882 Allergy status to sulfonamides status: Secondary | ICD-10-CM | POA: Insufficient documentation

## 2019-01-06 DIAGNOSIS — Z881 Allergy status to other antibiotic agents status: Secondary | ICD-10-CM | POA: Insufficient documentation

## 2019-01-06 HISTORY — PX: LAPAROSCOPIC APPENDECTOMY: SHX408

## 2019-01-06 LAB — GLUCOSE, CAPILLARY
Glucose-Capillary: 118 mg/dL — ABNORMAL HIGH (ref 70–99)
Glucose-Capillary: 154 mg/dL — ABNORMAL HIGH (ref 70–99)

## 2019-01-06 SURGERY — APPENDECTOMY, LAPAROSCOPIC
Anesthesia: General

## 2019-01-06 MED ORDER — ONDANSETRON HCL 4 MG/2ML IJ SOLN
INTRAMUSCULAR | Status: AC
  Filled 2019-01-06: qty 2

## 2019-01-06 MED ORDER — BUPIVACAINE-EPINEPHRINE 0.5% -1:200000 IJ SOLN
INTRAMUSCULAR | Status: DC | PRN
  Administered 2019-01-06: 5 mL
  Administered 2019-01-06: 14 mL

## 2019-01-06 MED ORDER — ALBUTEROL SULFATE HFA 108 (90 BASE) MCG/ACT IN AERS
INHALATION_SPRAY | RESPIRATORY_TRACT | Status: AC
  Filled 2019-01-06: qty 6.7

## 2019-01-06 MED ORDER — PROPOFOL 500 MG/50ML IV EMUL
INTRAVENOUS | Status: AC
  Filled 2019-01-06: qty 50

## 2019-01-06 MED ORDER — LIDOCAINE HCL (CARDIAC) PF 100 MG/5ML IV SOSY
PREFILLED_SYRINGE | INTRAVENOUS | Status: DC | PRN
  Administered 2019-01-06: 100 mg via INTRAVENOUS

## 2019-01-06 MED ORDER — FENTANYL CITRATE (PF) 100 MCG/2ML IJ SOLN
INTRAMUSCULAR | Status: DC | PRN
  Administered 2019-01-06: 100 ug via INTRAVENOUS

## 2019-01-06 MED ORDER — ASPIRIN EC 81 MG PO TBEC: 81.0000 mg | DELAYED_RELEASE_TABLET | Freq: Every day | ORAL | Status: DC

## 2019-01-06 MED ORDER — ONDANSETRON HCL 4 MG/2ML IJ SOLN: 4.0000 mg | Freq: Once | INTRAMUSCULAR | Status: DC | PRN

## 2019-01-06 MED ORDER — BUPIVACAINE-EPINEPHRINE (PF) 0.5% -1:200000 IJ SOLN
INTRAMUSCULAR | Status: AC
  Filled 2019-01-06: qty 30

## 2019-01-06 MED ORDER — MIDAZOLAM HCL 2 MG/2ML IJ SOLN
INTRAMUSCULAR | Status: DC | PRN
  Administered 2019-01-06: 2 mg via INTRAVENOUS

## 2019-01-06 MED ORDER — ALBUTEROL SULFATE HFA 108 (90 BASE) MCG/ACT IN AERS
INHALATION_SPRAY | RESPIRATORY_TRACT | Status: DC | PRN
  Administered 2019-01-06: 6 via RESPIRATORY_TRACT

## 2019-01-06 MED ORDER — HYDROCODONE-ACETAMINOPHEN 5-325 MG PO TABS: 1.0000 | ORAL_TABLET | Freq: Four times a day (QID) | ORAL | 0 refills | Status: AC | PRN

## 2019-01-06 MED ORDER — FENTANYL CITRATE (PF) 100 MCG/2ML IJ SOLN
25.0000 ug | INTRAMUSCULAR | Status: DC | PRN
  Administered 2019-01-06 (×2): 25 ug via INTRAVENOUS

## 2019-01-06 MED ORDER — ACETAMINOPHEN 325 MG PO TABS: 650.0000 mg | ORAL_TABLET | Freq: Three times a day (TID) | ORAL | 0 refills | Status: AC | PRN

## 2019-01-06 MED ORDER — ROCURONIUM BROMIDE 50 MG/5ML IV SOLN
INTRAVENOUS | Status: AC
  Filled 2019-01-06: qty 1

## 2019-01-06 MED ORDER — GLYCOPYRROLATE 0.2 MG/ML IJ SOLN
INTRAMUSCULAR | Status: AC
  Filled 2019-01-06: qty 1

## 2019-01-06 MED ORDER — SUGAMMADEX SODIUM 500 MG/5ML IV SOLN
INTRAVENOUS | Status: DC | PRN
  Administered 2019-01-06: 258 mg via INTRAVENOUS

## 2019-01-06 MED ORDER — LIDOCAINE HCL (PF) 2 % IJ SOLN
INTRAMUSCULAR | Status: AC
  Filled 2019-01-06: qty 10

## 2019-01-06 MED ORDER — LACTATED RINGERS IV SOLN
INTRAVENOUS | Status: DC | PRN
  Administered 2019-01-06: 08:00:00 via INTRAVENOUS

## 2019-01-06 MED ORDER — MIDAZOLAM HCL 2 MG/2ML IJ SOLN
INTRAMUSCULAR | Status: AC
  Filled 2019-01-06: qty 2

## 2019-01-06 MED ORDER — HYDROCODONE-ACETAMINOPHEN 5-325 MG PO TABS
1.0000 | ORAL_TABLET | Freq: Once | ORAL | Status: AC
  Administered 2019-01-06: 1 via ORAL

## 2019-01-06 MED ORDER — SUCCINYLCHOLINE CHLORIDE 20 MG/ML IJ SOLN
INTRAMUSCULAR | Status: AC
  Filled 2019-01-06: qty 1

## 2019-01-06 MED ORDER — FAMOTIDINE 20 MG PO TABS
ORAL_TABLET | ORAL | Status: AC
  Administered 2019-01-06: 20 mg via ORAL
  Filled 2019-01-06: qty 1

## 2019-01-06 MED ORDER — DOCUSATE SODIUM 100 MG PO CAPS: 100.0000 mg | ORAL_CAPSULE | Freq: Two times a day (BID) | ORAL | 0 refills | Status: AC | PRN

## 2019-01-06 MED ORDER — PROPOFOL 10 MG/ML IV BOLUS
INTRAVENOUS | Status: DC | PRN
  Administered 2019-01-06: 50 mg via INTRAVENOUS
  Administered 2019-01-06: 150 mg via INTRAVENOUS

## 2019-01-06 MED ORDER — PHENYLEPHRINE HCL (PRESSORS) 10 MG/ML IV SOLN
INTRAVENOUS | Status: AC
  Filled 2019-01-06: qty 1

## 2019-01-06 MED ORDER — ONDANSETRON HCL 4 MG/2ML IJ SOLN
INTRAMUSCULAR | Status: DC | PRN
  Administered 2019-01-06: 4 mg via INTRAVENOUS

## 2019-01-06 MED ORDER — CHLORHEXIDINE GLUCONATE CLOTH 2 % EX PADS: 6.0000 | MEDICATED_PAD | Freq: Once | CUTANEOUS | Status: DC

## 2019-01-06 MED ORDER — FENTANYL CITRATE (PF) 100 MCG/2ML IJ SOLN
INTRAMUSCULAR | Status: AC
  Filled 2019-01-06: qty 2

## 2019-01-06 MED ORDER — CEFAZOLIN SODIUM-DEXTROSE 2-4 GM/100ML-% IV SOLN
INTRAVENOUS | Status: AC
  Filled 2019-01-06: qty 100

## 2019-01-06 MED ORDER — FAMOTIDINE 20 MG PO TABS
20.0000 mg | ORAL_TABLET | Freq: Once | ORAL | Status: AC
  Administered 2019-01-06: 07:00:00 20 mg via ORAL

## 2019-01-06 MED ORDER — ROCURONIUM BROMIDE 100 MG/10ML IV SOLN
INTRAVENOUS | Status: DC | PRN
  Administered 2019-01-06: 40 mg via INTRAVENOUS
  Administered 2019-01-06: 10 mg via INTRAVENOUS

## 2019-01-06 MED ORDER — SUGAMMADEX SODIUM 500 MG/5ML IV SOLN
INTRAVENOUS | Status: AC
  Filled 2019-01-06: qty 5

## 2019-01-06 MED ORDER — FENTANYL CITRATE (PF) 100 MCG/2ML IJ SOLN
INTRAMUSCULAR | Status: AC
  Administered 2019-01-06: 25 ug via INTRAVENOUS
  Filled 2019-01-06: qty 2

## 2019-01-06 MED ORDER — SODIUM CHLORIDE 0.9 % IV SOLN
INTRAVENOUS | Status: DC
  Administered 2019-01-06: 07:00:00 via INTRAVENOUS

## 2019-01-06 MED ORDER — HYDROCODONE-ACETAMINOPHEN 5-325 MG PO TABS
ORAL_TABLET | ORAL | Status: AC
  Administered 2019-01-06: 1 via ORAL
  Filled 2019-01-06: qty 1

## 2019-01-06 MED ORDER — IBUPROFEN 800 MG PO TABS: 800.0000 mg | ORAL_TABLET | Freq: Three times a day (TID) | ORAL | 0 refills | Status: DC | PRN

## 2019-01-06 MED ORDER — SUCCINYLCHOLINE CHLORIDE 20 MG/ML IJ SOLN
INTRAMUSCULAR | Status: DC | PRN
  Administered 2019-01-06: 140 mg via INTRAVENOUS

## 2019-01-06 MED ORDER — GLYCOPYRROLATE 0.2 MG/ML IJ SOLN
INTRAMUSCULAR | Status: DC | PRN
  Administered 2019-01-06: 0.2 mg via INTRAVENOUS

## 2019-01-06 SURGICAL SUPPLY — 47 items
"PENCIL ELECTRO HAND CTR " (MISCELLANEOUS) ×1 IMPLANT
ANCHOR TIS RET SYS 235ML (MISCELLANEOUS) ×2 IMPLANT
APPLIER CLIP 5 13 M/L LIGAMAX5 (MISCELLANEOUS)
BLADE SURG SZ11 CARB STEEL (BLADE) ×2 IMPLANT
CANISTER SUCT 1200ML W/VALVE (MISCELLANEOUS) ×2 IMPLANT
CLIP APPLIE 5 13 M/L LIGAMAX5 (MISCELLANEOUS) IMPLANT
COVER WAND RF STERILE (DRAPES) IMPLANT
CUTTER FLEX LINEAR 45M (STAPLE) ×2 IMPLANT
DERMABOND ADVANCED (GAUZE/BANDAGES/DRESSINGS) ×1
DERMABOND ADVANCED .7 DNX12 (GAUZE/BANDAGES/DRESSINGS) ×1 IMPLANT
ELECT REM PT RETURN 9FT ADLT (ELECTROSURGICAL) ×2
ELECTRODE REM PT RTRN 9FT ADLT (ELECTROSURGICAL) ×1 IMPLANT
GLOVE BIOGEL PI IND STRL 7.0 (GLOVE) ×1 IMPLANT
GLOVE BIOGEL PI INDICATOR 7.0 (GLOVE) ×1
GLOVE SURG SYN 6.5 ES PF (GLOVE) ×2 IMPLANT
GLOVE SURG SYN 6.5 PF PI (GLOVE) ×1 IMPLANT
GOWN STRL REUS W/ TWL LRG LVL3 (GOWN DISPOSABLE) ×1 IMPLANT
GOWN STRL REUS W/TWL LRG LVL3 (GOWN DISPOSABLE) ×1
GRASPER SUT TROCAR 14GX15 (MISCELLANEOUS) ×2 IMPLANT
HANDLE YANKAUER SUCT BULB TIP (MISCELLANEOUS) ×2 IMPLANT
HEMOSTAT SURGICEL 2X3 (HEMOSTASIS) ×1 IMPLANT
IRRIGATION STRYKERFLOW (MISCELLANEOUS) ×1 IMPLANT
IRRIGATOR STRYKERFLOW (MISCELLANEOUS) ×2
IV NS 1000ML (IV SOLUTION) ×1
IV NS 1000ML BAXH (IV SOLUTION) ×1 IMPLANT
KIT TURNOVER KIT A (KITS) ×2 IMPLANT
L-HOOK LAP DISP 36CM (ELECTROSURGICAL) ×2
LHOOK LAP DISP 36CM (ELECTROSURGICAL) IMPLANT
LIGASURE LAP MARYLAND 5MM 37CM (ELECTROSURGICAL) IMPLANT
NEEDLE HYPO 22GX1.5 SAFETY (NEEDLE) ×2 IMPLANT
PACK LAP CHOLECYSTECTOMY (MISCELLANEOUS) ×2 IMPLANT
PENCIL ELECTRO HAND CTR (MISCELLANEOUS) ×2 IMPLANT
RELOAD 45 VASCULAR/THIN (ENDOMECHANICALS) IMPLANT
RELOAD STAPLE 45 2.5 WHT GRN (ENDOMECHANICALS) IMPLANT
RELOAD STAPLE 45 3.5 BLU ETS (ENDOMECHANICALS) ×1 IMPLANT
RELOAD STAPLE TA45 3.5 REG BLU (ENDOMECHANICALS) ×2 IMPLANT
SCISSORS METZENBAUM CVD 33 (INSTRUMENTS) ×2 IMPLANT
SET TUBE SMOKE EVAC HIGH FLOW (TUBING) ×2 IMPLANT
SUT MNCRL AB 4-0 PS2 18 (SUTURE) ×2 IMPLANT
SUT VIC AB 3-0 SH 27 (SUTURE) ×1
SUT VIC AB 3-0 SH 27X BRD (SUTURE) ×1 IMPLANT
SUT VICRYL 0 AB UR-6 (SUTURE) ×4 IMPLANT
SUT VICRYL PLUS ABS 0 54 (SUTURE) ×2 IMPLANT
TRAY FOLEY MTR SLVR 16FR STAT (SET/KITS/TRAYS/PACK) ×2 IMPLANT
TROCAR XCEL 12X100 BLDLESS (ENDOMECHANICALS) ×2 IMPLANT
TROCAR XCEL BLUNT TIP 100MML (ENDOMECHANICALS) IMPLANT
TROCAR XCEL NON-BLD 5MMX100MML (ENDOMECHANICALS) ×4 IMPLANT

## 2019-01-06 NOTE — Discharge Instructions (Signed)
Laparoscopic Appendectomy, Care After This sheet gives you information about how to care for yourself after your procedure. Your doctor may also give you more specific instructions. If you have problems or questions, contact your doctor. Follow these instructions at home: Care for cuts from surgery (incisions)   Follow instructions from your doctor about how to take care of your cuts from surgery. Make sure you: ? Wash your hands with soap and water before you change your bandage (dressing). If you cannot use soap and water, use hand sanitizer. ? Change your bandage as told by your doctor. ? Leave stitches (sutures), skin glue, or skin tape (adhesive) strips in place. They may need to stay in place for 2 weeks or longer. If tape strips get loose and curl up, you may trim the loose edges. Do not remove tape strips completely unless your doctor says it is okay.  Do not take baths, swim, or use a hot tub until your doctor says it is okay. OK TO SHOWER 24HRS AFTER YOUR SURGERY.   Check your surgical cut area every day for signs of infection. Check for: ? More redness, swelling, or pain. ? More fluid or blood. ? Warmth. ? Pus or a bad smell. Activity  Do not drive or use heavy machinery while taking prescription pain medicine.  Do not play contact sports until your doctor says it is okay.  Do not drive for 24 hours if you were given a medicine to help you relax (sedative).  Rest as needed. Do not return to work or school until your doctor says it is okay. General instructions .  tylenol and advil as needed for discomfort.  Please alternate between the two every four hours as needed for pain.   .  Use narcotics, if prescribed, only when tylenol and motrin is not enough to control pain. .  325-650mg every 8hrs to max of 3000mg/24hrs (including the 325mg in every norco dose) for the tylenol.   .  Advil up to 800mg per dose every 8hrs as needed for pain.    To prevent or treat constipation  while you are taking prescription pain medicine, your doctor may recommend that you: ? Drink enough fluid to keep your pee (urine) clear or pale yellow. ? Take over-the-counter or prescription medicines. ? Eat foods that are high in fiber, such as fresh fruits and vegetables, whole grains, and beans. ? Limit foods that are high in fat and processed sugars, such as fried and sweet foods. Contact a doctor if:  You develop a rash.  You have more redness, swelling, or pain around your surgical cuts.  You have more fluid or blood coming from your surgical cuts.  Your surgical cuts feel warm to the touch.  You have pus or a bad smell coming from your surgical cuts.  You have a fever.  One or more of your surgical cuts breaks open. Get help right away if:  You have trouble breathing.  You have chest pain.  You have pain that is getting worse in your shoulders.  You faint or feel dizzy when you stand.  You have very bad pain in your belly (abdomen).  You are sick to your stomach (nauseous) for more than one day.  You have throwing up (vomiting) that lasts for more than one day.  You have leg pain. This information is not intended to replace advice given to you by your health care provider. Make sure you discuss any questions you have with your   health care provider. Document Released: 04/03/2008 Document Revised: 01/14/2016 Document Reviewed: 12/12/2015 Elsevier Interactive Patient Education  2019 [employer]   AMBULATORY SURGERY  DISCHARGE INSTRUCTIONS   1) The drugs that you were given will stay in your system until tomorrow so for the next 24 hours you should not:  A) Drive an automobile B) Make any legal decisions C) Drink any alcoholic beverage   2) You may resume regular meals tomorrow.  Today it is better to start with liquids and gradually work up to solid foods.  You may eat anything you prefer, but it is better to start with liquids, then soup and crackers,  and gradually work up to solid foods.   3) Please notify your doctor immediately if you have any unusual bleeding, trouble breathing, redness and pain at the surgery site, drainage, fever, or pain not relieved by medication.    4) Additional Instructions:        Please contact your physician with any problems or Same Day Surgery at [phone], Monday through Friday 6 am to 4 pm, or [facility] number at [phone]. 

## 2019-01-06 NOTE — Anesthesia Post-op Follow-up Note (Signed)
Anesthesia QCDR form completed.        

## 2019-01-06 NOTE — Anesthesia Procedure Notes (Signed)
Procedure Name: Intubation Date/Time: 01/06/2019 7:45 AM Performed by: Rona Ravens, CRNA Pre-anesthesia Checklist: Patient identified, Emergency Drugs available, Suction available and Patient being monitored Patient Re-evaluated:Patient Re-evaluated prior to induction Oxygen Delivery Method: Circle system utilized Preoxygenation: Pre-oxygenation with 100% oxygen Induction Type: IV induction and Rapid sequence Laryngoscope Size: McGraph and 4 (First attempt with MAC 4 Grade 3 view ) Grade View: Grade II Tube type: Oral Tube size: 7.0 mm Number of attempts: 2 Placement Confirmation: ETT inserted through vocal cords under direct vision,  positive ETCO2,  CO2 detector and breath sounds checked- equal and bilateral Secured at: 21 cm Tube secured with: Tape Dental Injury: Teeth and Oropharynx as per pre-operative assessment  Difficulty Due To: Difficulty was anticipated, Difficult Airway- due to limited oral opening, Difficult Airway- due to immobile epiglottis and Difficult Airway- due to large tongue

## 2019-01-06 NOTE — Anesthesia Postprocedure Evaluation (Signed)
Anesthesia Post Note  Patient: Tracy Hood  Procedure(s) Performed: APPENDECTOMY LAPAROSCOPIC, DIABETIC (N/A )  Patient location during evaluation: PACU Anesthesia Type: General Level of consciousness: awake and alert Pain management: pain level controlled Vital Signs Assessment: post-procedure vital signs reviewed and stable Respiratory status: spontaneous breathing, nonlabored ventilation, respiratory function stable and patient connected to nasal cannula oxygen Cardiovascular status: blood pressure returned to baseline and stable Postop Assessment: no apparent nausea or vomiting Anesthetic complications: no     Last Vitals:  Vitals:   01/06/19 1053 01/06/19 1133  BP:  (!) 154/80  Pulse:    Resp:  18  Temp:  (!) 36.4 C  SpO2: 96% 98%    Last Pain:  Vitals:   01/06/19 1044  TempSrc: Temporal  PainSc: 0-No pain                 De Libman S

## 2019-01-06 NOTE — Transfer of Care (Signed)
Immediate Anesthesia Transfer of Care Note  Patient: Tracy Hood  Procedure(s) Performed: APPENDECTOMY LAPAROSCOPIC, DIABETIC (N/A )  Patient Location: PACU  Anesthesia Type:General  Level of Consciousness: awake, alert  and oriented  Airway & Oxygen Therapy: Patient Spontanous Breathing and Patient connected to face mask oxygen  Post-op Assessment: Report given to RN and Post -op Vital signs reviewed and stable  Post vital signs: Reviewed and stable  Last Vitals:  Vitals Value Taken Time  BP 149/51 01/06/19 0950  Temp    Pulse 67 01/06/19 0952  Resp    SpO2 99 % 01/06/19 0952  Vitals shown include unvalidated device data.  Last Pain:  Vitals:   01/06/19 0617  TempSrc: Temporal  PainSc: 0-No pain         Complications: No apparent anesthesia complications

## 2019-01-06 NOTE — Interval H&P Note (Signed)
History and Physical Interval Note:  01/06/2019 7:20 AM  Tracy Hood  has presented today for surgery, with the diagnosis of appendicitis.  The various methods of treatment have been discussed with the patient and family. After consideration of risks, benefits and other options for treatment, the patient has consented to  Procedure(s): APPENDECTOMY LAPAROSCOPIC, DIABETIC (N/A) as a surgical intervention.  The patient's history has been reviewed, patient examined, no change in status, stable for surgery.  I have reviewed the patient's chart and labs.  Questions were answered to the patient's satisfaction.     Jermeka Schlotterbeck Lysle Pearl

## 2019-01-06 NOTE — Op Note (Signed)
Preoperative diagnosis: Chronic appendicitis.  Postoperative diagnosis: Chronic appendicitis  Procedure: Laparoscopic appendectomy.  Anesthesia: GETA  Surgeon: Benjamine Sprague  Wound Classification: clean contaminated  Specimen: Appendix  Complications: None  Estimated Blood Loss: 3 mL   Indications: Patient is a 58 y.o. female  presented with right lower quadrant pain.  Computed tomography scan and physical examination were consistent with acute appendicitis with perforation few months ago, treated with ABX.  Here today for interval appy.  Findings: 1. Chronic inflammed appendiceal tip appendix 2. No peri-appendiceal abscess or phlegmon 3. Normal anatomy 4. Appendiceal artery ligated and divided with EndoGIA 5. Adequate hemostasis.   Description of procedure: The patient was placed on the operating table in the supine position, left arm tucked. General anesthesia was induced. A time-out was completed verifying correct patient, procedure, site, positioning, and implant(s) and/or special equipment prior to beginning this procedure. A Foley catheter placed. The abdomen was prepped and draped in the usual sterile fashion.   After local was infused, an incision was made inferior to the umbilicus.  The fascia was elevated with Coker's and 0 Vicryl stay sutures were placed on either side of the midline.  11 blade was then used to make an midline incision down through the fascia.  Additional 50min required to enter the peritoneal cavity under direct visualization due to patient's large body habitus.  Hasson port then placed through this and abdomen insufflated with carbon dioxide to a pressure of 15 mmHg. The patient tolerated insufflation well.  The laparoscope was inserted and the abdomen inspected. No injuries from initial trocar placement were noted. One 2mm port and another 5-mm port was then placed above the symphysis pubis on midline and LLQ.  Care was taken to avoid injury to the bladder  or inferior epigastric vessels. The table was placed in the Trendelenburg position with the right side elevated.  Chronically inflamed appendix tip was identified and extensive dissection carried out with combination of maryland and ligasure to free the tip of appendix from surrounding tissue and finally elevated.  The dissection was made difficult again due to patient's excess adipose tissue surrounding her cecum.  Window created at base of appendix in the mesentery.   An endoscopic blue load linear cutting stapler was then used to divide and staple the base of the appendix. The remaining mesentary was ligated with ligasure.  The appendix was placed in an endoscopic retrieval bag and removed, along with fat pad adjacent to appendix that required resection for adequate visualization of the base.  The appendiceal stump was examined and hemostasis noted, but some active bleeding noted from mesoappendix that was eventually stopped with additional ligasure ligation.  No other pathology was identified within pelvis. The mesoappendix end was wrapped with surgicell for extra security.  No bleeding was noted. The umbilical trocar removed and port site closed with PMI using 0 vicryl under direct vision.  Remaining port site removed and the abdomen was allowed to collapse. Deep dermal then closed with 3-0 vicryl interrupted fashion.  All skin incisions then closed with subcuticular sutures Monocryl 4-0.  Wounds then dressed with dermabond.  The patient tolerated the procedure well, foley removed, awakened from anesthesia and was taken to the postanesthesia care unit in satisfactory condition.  Sponge count and instrument count correct at the end of the procedure.

## 2019-01-06 NOTE — Anesthesia Preprocedure Evaluation (Signed)
Anesthesia Evaluation  Patient identified by MRN, date of birth, ID band Patient awake    Reviewed: Allergy & Precautions, NPO status , Patient's Chart, lab work & pertinent test results, reviewed documented beta blocker date and time   Airway Mallampati: III  TM Distance: >3 FB     Dental  (+) Chipped   Pulmonary shortness of breath, sleep apnea and Continuous Positive Airway Pressure Ventilation , COPD, former smoker,           Cardiovascular hypertension, Pt. on medications      Neuro/Psych Anxiety    GI/Hepatic   Endo/Other  diabetes, Type 2Morbid obesity  Renal/GU      Musculoskeletal  (+) Arthritis ,   Abdominal   Peds  Hematology   Anesthesia Other Findings EKG ok.  Reproductive/Obstetrics                             Anesthesia Physical Anesthesia Plan  ASA: III  Anesthesia Plan: General   Post-op Pain Management:    Induction: Intravenous  PONV Risk Score and Plan:   Airway Management Planned: Oral ETT  Additional Equipment:   Intra-op Plan:   Post-operative Plan:   Informed Consent: I have reviewed the patients History and Physical, chart, labs and discussed the procedure including the risks, benefits and alternatives for the proposed anesthesia with the patient or authorized representative who has indicated his/her understanding and acceptance.       Plan Discussed with: CRNA  Anesthesia Plan Comments:         Anesthesia Quick Evaluation

## 2019-01-08 LAB — SURGICAL PATHOLOGY

## 2019-08-16 ENCOUNTER — Encounter: Payer: Self-pay | Admitting: Emergency Medicine

## 2019-08-16 ENCOUNTER — Emergency Department: Payer: Medicaid Other

## 2019-08-16 ENCOUNTER — Observation Stay: Payer: Medicaid Other

## 2019-08-16 ENCOUNTER — Inpatient Hospital Stay
Admission: EM | Admit: 2019-08-16 | Discharge: 2019-08-19 | DRG: 190 | Disposition: A | Discharge status: Home / Self Care | Payer: Medicaid Other | Attending: Internal Medicine | Admitting: Internal Medicine

## 2019-08-16 DIAGNOSIS — Z79899 Other long term (current) drug therapy: Secondary | ICD-10-CM

## 2019-08-16 DIAGNOSIS — Z7982 Long term (current) use of aspirin: Secondary | ICD-10-CM

## 2019-08-16 DIAGNOSIS — Z87891 Personal history of nicotine dependence: Secondary | ICD-10-CM

## 2019-08-16 DIAGNOSIS — Z20822 Contact with and (suspected) exposure to covid-19: Secondary | ICD-10-CM | POA: Diagnosis present

## 2019-08-16 DIAGNOSIS — K589 Irritable bowel syndrome without diarrhea: Secondary | ICD-10-CM | POA: Diagnosis present

## 2019-08-16 DIAGNOSIS — Z7984 Long term (current) use of oral hypoglycemic drugs: Secondary | ICD-10-CM

## 2019-08-16 DIAGNOSIS — I152 Hypertension secondary to endocrine disorders: Secondary | ICD-10-CM | POA: Diagnosis present

## 2019-08-16 DIAGNOSIS — J9601 Acute respiratory failure with hypoxia: Secondary | ICD-10-CM

## 2019-08-16 DIAGNOSIS — J441 Chronic obstructive pulmonary disease with (acute) exacerbation: Principal | ICD-10-CM

## 2019-08-16 DIAGNOSIS — R109 Unspecified abdominal pain: Secondary | ICD-10-CM

## 2019-08-16 DIAGNOSIS — R0602 Shortness of breath: Secondary | ICD-10-CM

## 2019-08-16 DIAGNOSIS — J9621 Acute and chronic respiratory failure with hypoxia: Secondary | ICD-10-CM | POA: Diagnosis present

## 2019-08-16 DIAGNOSIS — E1165 Type 2 diabetes mellitus with hyperglycemia: Secondary | ICD-10-CM

## 2019-08-16 DIAGNOSIS — R1013 Epigastric pain: Secondary | ICD-10-CM

## 2019-08-16 DIAGNOSIS — E785 Hyperlipidemia, unspecified: Secondary | ICD-10-CM | POA: Diagnosis present

## 2019-08-16 DIAGNOSIS — M7989 Other specified soft tissue disorders: Secondary | ICD-10-CM

## 2019-08-16 DIAGNOSIS — E669 Obesity, unspecified: Secondary | ICD-10-CM | POA: Diagnosis present

## 2019-08-16 DIAGNOSIS — G4733 Obstructive sleep apnea (adult) (pediatric): Secondary | ICD-10-CM | POA: Diagnosis present

## 2019-08-16 DIAGNOSIS — E1159 Type 2 diabetes mellitus with other circulatory complications: Secondary | ICD-10-CM | POA: Diagnosis present

## 2019-08-16 DIAGNOSIS — E876 Hypokalemia: Secondary | ICD-10-CM | POA: Diagnosis present

## 2019-08-16 DIAGNOSIS — T380X5A Adverse effect of glucocorticoids and synthetic analogues, initial encounter: Secondary | ICD-10-CM | POA: Diagnosis present

## 2019-08-16 DIAGNOSIS — Z7951 Long term (current) use of inhaled steroids: Secondary | ICD-10-CM

## 2019-08-16 DIAGNOSIS — Z882 Allergy status to sulfonamides status: Secondary | ICD-10-CM

## 2019-08-16 DIAGNOSIS — J440 Chronic obstructive pulmonary disease with acute lower respiratory infection: Secondary | ICD-10-CM | POA: Diagnosis present

## 2019-08-16 DIAGNOSIS — Z6827 Body mass index (BMI) 27.0-27.9, adult: Secondary | ICD-10-CM

## 2019-08-16 DIAGNOSIS — I10 Essential (primary) hypertension: Secondary | ICD-10-CM | POA: Diagnosis present

## 2019-08-16 DIAGNOSIS — E119 Type 2 diabetes mellitus without complications: Secondary | ICD-10-CM

## 2019-08-16 DIAGNOSIS — Z9981 Dependence on supplemental oxygen: Secondary | ICD-10-CM

## 2019-08-16 DIAGNOSIS — I82409 Acute embolism and thrombosis of unspecified deep veins of unspecified lower extremity: Secondary | ICD-10-CM

## 2019-08-16 DIAGNOSIS — R0902 Hypoxemia: Secondary | ICD-10-CM

## 2019-08-16 DIAGNOSIS — E1169 Type 2 diabetes mellitus with other specified complication: Secondary | ICD-10-CM

## 2019-08-16 DIAGNOSIS — F419 Anxiety disorder, unspecified: Secondary | ICD-10-CM | POA: Diagnosis present

## 2019-08-16 HISTORY — DX: Acute respiratory failure with hypoxia: J96.01

## 2019-08-16 LAB — CBC WITH DIFFERENTIAL/PLATELET
Abs Immature Granulocytes: 0.03 10*3/uL (ref 0.00–0.07)
Basophils Absolute: 0.1 10*3/uL (ref 0.0–0.1)
Basophils Relative: 1 %
Eosinophils Absolute: 0.4 10*3/uL (ref 0.0–0.5)
Eosinophils Relative: 4 %
HCT: 42.4 % (ref 36.0–46.0)
Hemoglobin: 14.6 g/dL (ref 12.0–15.0)
Immature Granulocytes: 0 %
Lymphocytes Relative: 44 %
Lymphs Abs: 4 10*3/uL (ref 0.7–4.0)
MCH: 28.1 pg (ref 26.0–34.0)
MCHC: 34.4 g/dL (ref 30.0–36.0)
MCV: 81.5 fL (ref 80.0–100.0)
Monocytes Absolute: 0.5 10*3/uL (ref 0.1–1.0)
Monocytes Relative: 6 %
Neutro Abs: 4.1 10*3/uL (ref 1.7–7.7)
Neutrophils Relative %: 45 %
Platelets: 230 10*3/uL (ref 150–400)
RBC: 5.2 MIL/uL — ABNORMAL HIGH (ref 3.87–5.11)
RDW: 13.5 % (ref 11.5–15.5)
WBC: 9.1 10*3/uL (ref 4.0–10.5)
nRBC: 0 % (ref 0.0–0.2)

## 2019-08-16 LAB — BRAIN NATRIURETIC PEPTIDE: B Natriuretic Peptide: 38 pg/mL (ref 0.0–100.0)

## 2019-08-16 LAB — COMPREHENSIVE METABOLIC PANEL
ALT: 27 U/L (ref 0–44)
AST: 60 U/L — ABNORMAL HIGH (ref 15–41)
Albumin: 3.7 g/dL (ref 3.5–5.0)
Alkaline Phosphatase: 77 U/L (ref 38–126)
Anion gap: 9 (ref 5–15)
BUN: 10 mg/dL (ref 6–20)
CO2: 25 mmol/L (ref 22–32)
Calcium: 8.6 mg/dL — ABNORMAL LOW (ref 8.9–10.3)
Chloride: 104 mmol/L (ref 98–111)
Creatinine, Ser: 0.76 mg/dL (ref 0.44–1.00)
GFR calc Af Amer: >60 mL/min (ref >60)
GFR calc non Af Amer: >60 mL/min (ref >60)
Glucose, Bld: 136 mg/dL — ABNORMAL HIGH (ref 70–99)
Potassium: 2.9 mmol/L — ABNORMAL LOW (ref 3.5–5.1)
Sodium: 138 mmol/L (ref 135–145)
Total Bilirubin: 0.9 mg/dL (ref 0.3–1.2)
Total Protein: 7.2 g/dL (ref 6.5–8.1)

## 2019-08-16 LAB — LIPASE, BLOOD: Lipase: 31 U/L (ref 11–51)

## 2019-08-16 LAB — GLUCOSE, CAPILLARY: Glucose-Capillary: 149 mg/dL — ABNORMAL HIGH (ref 70–99)

## 2019-08-16 LAB — POC SARS CORONAVIRUS 2 AG: SARS Coronavirus 2 Ag: NEGATIVE

## 2019-08-16 LAB — MAGNESIUM: Magnesium: 1.8 mg/dL (ref 1.7–2.4)

## 2019-08-16 LAB — RESPIRATORY PANEL BY RT PCR (FLU A&B, COVID)
Influenza A by PCR: NEGATIVE
Influenza B by PCR: NEGATIVE
SARS Coronavirus 2 by RT PCR: NEGATIVE

## 2019-08-16 LAB — TROPONIN I (HIGH SENSITIVITY): Troponin I (High Sensitivity): 3 ng/L (ref <18)

## 2019-08-16 MED ORDER — ONDANSETRON HCL 4 MG/2ML IJ SOLN: 4.0000 mg | Freq: Four times a day (QID) | INTRAMUSCULAR | Status: DC | PRN

## 2019-08-16 MED ORDER — ROSUVASTATIN CALCIUM 20 MG PO TABS
40.0000 mg | ORAL_TABLET | Freq: Every day | ORAL | Status: DC
  Administered 2019-08-17 – 2019-08-18 (×2): 40 mg via ORAL
  Filled 2019-08-16 (×3): qty 2
  Filled 2019-08-16: qty 4

## 2019-08-16 MED ORDER — IPRATROPIUM-ALBUTEROL 20-100 MCG/ACT IN AERS
1.0000 | INHALATION_SPRAY | Freq: Four times a day (QID) | RESPIRATORY_TRACT | Status: DC
  Administered 2019-08-17 (×3): 1 via RESPIRATORY_TRACT
  Filled 2019-08-16: qty 4

## 2019-08-16 MED ORDER — ONDANSETRON HCL 4 MG/2ML IJ SOLN
4.0000 mg | Freq: Once | INTRAMUSCULAR | Status: AC
  Administered 2019-08-16: 4 mg via INTRAVENOUS
  Filled 2019-08-16: qty 2

## 2019-08-16 MED ORDER — ALUM & MAG HYDROXIDE-SIMETH 200-200-20 MG/5ML PO SUSP
30.0000 mL | Freq: Once | ORAL | Status: AC
  Administered 2019-08-16: 30 mL via ORAL
  Filled 2019-08-16: qty 30

## 2019-08-16 MED ORDER — ONDANSETRON HCL 4 MG PO TABS: 4.0000 mg | ORAL_TABLET | Freq: Four times a day (QID) | ORAL | Status: DC | PRN

## 2019-08-16 MED ORDER — POTASSIUM CHLORIDE 20 MEQ/15ML (10%) PO SOLN
40.0000 meq | Freq: Once | ORAL | Status: AC
  Administered 2019-08-16: 22:00:00 40 meq via ORAL
  Filled 2019-08-16: qty 30

## 2019-08-16 MED ORDER — SODIUM CHLORIDE 0.9 % IV SOLN
500.0000 mg | Freq: Once | INTRAVENOUS | Status: AC
  Administered 2019-08-16: 500 mg via INTRAVENOUS
  Filled 2019-08-16: qty 500

## 2019-08-16 MED ORDER — AMITRIPTYLINE HCL 25 MG PO TABS
75.0000 mg | ORAL_TABLET | Freq: Every day | ORAL | Status: DC
  Administered 2019-08-16 – 2019-08-18 (×3): 75 mg via ORAL
  Filled 2019-08-16 (×3): qty 3

## 2019-08-16 MED ORDER — ENOXAPARIN SODIUM 40 MG/0.4ML ~~LOC~~ SOLN
40.0000 mg | SUBCUTANEOUS | Status: DC
  Administered 2019-08-16 – 2019-08-18 (×3): 40 mg via SUBCUTANEOUS
  Filled 2019-08-16 (×3): qty 0.4

## 2019-08-16 MED ORDER — METHYLPREDNISOLONE SODIUM SUCC 125 MG IJ SOLR
125.0000 mg | Freq: Once | INTRAMUSCULAR | Status: AC
  Administered 2019-08-16: 125 mg via INTRAVENOUS
  Filled 2019-08-16: qty 2

## 2019-08-16 MED ORDER — ACETAMINOPHEN 650 MG RE SUPP: 650.0000 mg | Freq: Four times a day (QID) | RECTAL | Status: DC | PRN

## 2019-08-16 MED ORDER — ACETAMINOPHEN 325 MG PO TABS
650.0000 mg | ORAL_TABLET | Freq: Four times a day (QID) | ORAL | Status: DC | PRN
  Administered 2019-08-18: 20:00:00 650 mg via ORAL
  Filled 2019-08-16: qty 2

## 2019-08-16 MED ORDER — AZITHROMYCIN 250 MG PO TABS
250.0000 mg | ORAL_TABLET | Freq: Every day | ORAL | Status: DC
  Administered 2019-08-17 – 2019-08-19 (×3): 250 mg via ORAL
  Filled 2019-08-16 (×3): qty 1

## 2019-08-16 MED ORDER — AMLODIPINE BESYLATE 10 MG PO TABS
10.0000 mg | ORAL_TABLET | Freq: Every day | ORAL | Status: DC
  Administered 2019-08-17 – 2019-08-19 (×3): 10 mg via ORAL
  Filled 2019-08-16 (×3): qty 1

## 2019-08-16 MED ORDER — GUAIFENESIN ER 600 MG PO TB12
600.0000 mg | ORAL_TABLET | Freq: Two times a day (BID) | ORAL | Status: DC
  Administered 2019-08-16 – 2019-08-19 (×6): 600 mg via ORAL
  Filled 2019-08-16 (×6): qty 1

## 2019-08-16 MED ORDER — DICYCLOMINE HCL 20 MG PO TABS
20.0000 mg | ORAL_TABLET | Freq: Four times a day (QID) | ORAL | Status: DC
  Administered 2019-08-16 – 2019-08-19 (×10): 20 mg via ORAL
  Filled 2019-08-16 (×13): qty 1

## 2019-08-16 MED ORDER — ALBUTEROL SULFATE HFA 108 (90 BASE) MCG/ACT IN AERS
1.0000 | INHALATION_SPRAY | RESPIRATORY_TRACT | Status: DC | PRN
  Filled 2019-08-16: qty 6.7

## 2019-08-16 MED ORDER — POTASSIUM CHLORIDE CRYS ER 20 MEQ PO TBCR
40.0000 meq | EXTENDED_RELEASE_TABLET | Freq: Once | ORAL | Status: AC
  Administered 2019-08-16: 20:00:00 40 meq via ORAL
  Filled 2019-08-16: qty 2

## 2019-08-16 MED ORDER — METHYLPREDNISOLONE SODIUM SUCC 40 MG IJ SOLR
40.0000 mg | Freq: Two times a day (BID) | INTRAMUSCULAR | Status: DC
  Administered 2019-08-17 – 2019-08-19 (×5): 40 mg via INTRAVENOUS
  Filled 2019-08-16 (×5): qty 1

## 2019-08-16 MED ORDER — INSULIN ASPART 100 UNIT/ML ~~LOC~~ SOLN
0.0000 [IU] | Freq: Three times a day (TID) | SUBCUTANEOUS | Status: DC
  Administered 2019-08-17 – 2019-08-18 (×4): 2 [IU] via SUBCUTANEOUS
  Administered 2019-08-18: 5 [IU] via SUBCUTANEOUS
  Administered 2019-08-18 – 2019-08-19 (×3): 3 [IU] via SUBCUTANEOUS
  Filled 2019-08-16 (×8): qty 1

## 2019-08-16 NOTE — ED Provider Notes (Signed)
[facility] Emergency Department Provider Note  ____________________________________________   First MD Initiated Contact with Patient 08/16/19 1811     (approximate)  I have reviewed the triage vital signs and the nursing notes.  History  Chief Complaint Abdominal Pain    HPI Tracy Hood is a 59 y.o. female with history of COPD (not on supplemental oxygen), sleep apnea on CPAP nightly, IBS, DM who presents to the emergency department complaining of abdominal pain.  Patient reports acute onset of abdominal pain that started approximately 2 hours prior to arrival.  Located in the epigastrium, describes it as a pressure sensation, 10/10 in severity currently.  Does not radiate.  No alleviating or aggravating components.  Associated with nausea and one episode of NB/NB emesis in route to the hospital.  Denies any diarrhea or urinary symptoms. Does admit to drinking tequila prior to the onset of symptoms.  No fevers.  She was noted to be hypoxic to the 70s on RA, requiring 5 L nasal cannula to maintain oxygen >88%. Does not normally wear oxygen during the day w/ regards to her COPD. She reports some mild shortness of breath but states this is chronic, maybe a little bit worse today.  Denies any cough.  Lives with her son who recently tested positive for COVID.   Past Medical Hx Past Medical History:  Diagnosis Date  . Anxiety   . Arthritis   . COPD (chronic obstructive pulmonary disease) ([place])   . Diabetes mellitus without complication ([place])    type 2  . Dyspnea   . Hypertension   . IBS (irritable bowel syndrome)   . Obesity   . Sleep apnea    STARTED USING CPAP 3 MONTHS AGO    Problem List Patient Active Problem List   Diagnosis Date Noted  . Diabetes ([place]) 02/03/2018  . Essential hypertension 02/03/2018  . Appendicitis with peritoneal abscess 01/28/2018    Past Surgical Hx Past Surgical History:  Procedure Laterality Date  . COLONOSCOPY WITH  PROPOFOL N/A 12/05/2018   Procedure: COLONOSCOPY WITH PROPOFOL;  Surgeon: Lollie Sails, MD;  Location: [facility] ENDOSCOPY;  Service: Endoscopy;  Laterality: N/A;  . IR RADIOLOGIST EVAL & MGMT  02/05/2018  . JOINT REPLACEMENT Left 2018   tkr  . LAPAROSCOPIC APPENDECTOMY N/A 01/06/2019   Procedure: APPENDECTOMY LAPAROSCOPIC, DIABETIC;  Surgeon: Benjamine Sprague, DO;  Location: [facility] ORS;  Service: General;  Laterality: N/A;  . TUBAL LIGATION  1990    Medications Prior to Admission medications   Medication Sig Start Date End Date Taking? Authorizing Provider  albuterol (PROVENTIL HFA;VENTOLIN HFA) 108 (90 Base) MCG/ACT inhaler Inhale 2 puffs into the lungs every 6 (six) hours as needed for wheezing or shortness of breath. 04/09/18   Fisher, Linden Dolin, PA-C  amitriptyline (ELAVIL) 25 MG tablet Take 75 mg by mouth at bedtime.    [provider]  amLODipine (NORVASC) 10 MG tablet Take 10 mg by mouth daily.    [provider]  aspirin EC 81 MG tablet Take 1 tablet (81 mg total) by mouth daily. RESUME AFTER 48 HRS FROM SURGERY 01/06/19   Lysle Pearl, Isami, DO  budesonide-formoterol (SYMBICORT) 160-4.5 MCG/ACT inhaler Inhale 1 puff into the lungs 2 (two) times daily.     [provider]  dicyclomine (BENTYL) 20 MG tablet Take 20 mg by mouth 4 (four) times daily.     [provider]  empagliflozin (JARDIANCE) 25 MG TABS tablet Take 25 mg by mouth daily.  [provider]  ergocalciferol (VITAMIN D2) 1.25 MG (50000 UT) capsule Take 50,000 Units by mouth every Thursday.    [provider]  ibuprofen (ADVIL) 800 MG tablet Take 1 tablet (800 mg total) by mouth every 8 (eight) hours as needed for mild pain or moderate pain. 01/06/19   Lysle Pearl, Isami, DO  metFORMIN (GLUCOPHAGE-XR) 500 MG 24 hr tablet Take 1,000 mg by mouth daily with breakfast.    [provider]  Multiple Vitamins-Minerals (MULTIVITAMIN WOMEN 50+ PO) Take 1 tablet by mouth daily.    [provider]  rosuvastatin (CRESTOR) 40 MG tablet Take 40 mg by mouth daily.     [provider]    Allergies Sulfa antibiotics  Family Hx No family history on file.  Social Hx Social History   Tobacco Use  . Smoking status: Former Smoker    Types: Cigarettes    Quit date: 2019    Years since quitting: 2.1  . Smokeless tobacco: Never Used  Substance Use Topics  . Alcohol use: Yes    Alcohol/week: 4.0 standard drinks    Types: 4 Glasses of wine per week  . Drug use: Never     Review of Systems  Constitutional: Negative for fever, chills. Eyes: Negative for visual changes. ENT: Negative for sore throat. Cardiovascular: Negative for chest pain. Respiratory: + for shortness of breath. Gastrointestinal: + for nausea, vomiting, abdominal pain.  Genitourinary: Negative for dysuria. Musculoskeletal: Negative for leg swelling. Skin: Negative for rash. Neurological: Negative for headaches.   Physical Exam  Vital Signs: ED Triage Vitals  Enc Vitals Group     BP 08/16/19 1815 124/62     Pulse Rate 08/16/19 1815 66     Resp 08/16/19 1815 (!) 23     Temp 08/16/19 1824 97.7 F (36.5 C)     Temp Source 08/16/19 1824 Oral     SpO2 08/16/19 1805 (!) 85 %     Weight --      Height --      Head Circumference --      Peak Flow --      Pain Score 08/16/19 1812 10     Pain Loc --      Pain Edu? --      Excl. in [place]? --     Constitutional: Alert and oriented.  Head: Normocephalic. Atraumatic. Eyes: Conjunctivae clear. Sclera anicteric. Nose: No congestion. No rhinorrhea. Mouth/Throat: Wearing mask.  Neck: No stridor.   Cardiovascular: Normal rate, regular rhythm. Extremities well perfused. Respiratory: RR 20-25. BS distant 2/2 body habitus.  On 5L nasal cannula Gastrointestinal: Soft. TTP in epigastrium, no rebound or guarding. Remainder abdomen soft and NT. Musculoskeletal: No lower extremity edema. No deformities. Neurologic:  Normal speech and language.  No gross focal neurologic deficits are appreciated.  Skin: Skin is warm, dry and intact. No rash noted. Psychiatric: Mood and affect are appropriate for situation.  EKG  Personally reviewed.   Rate: 63 Rhythm: sinus Axis: normal Intervals: WNL Non-specific ST/T wave changes in the lateral precordial leads No STEMI    Radiology  CXR: IMPRESSION:  1. Moderate changes of acute bronchitis and/or asthma.  2. Streaky atelectasis versus bronchopneumonia involving the RIGHT  lung base.    Procedures  Procedure(s) performed (including critical care):  .Critical Care Performed by: Lilia Pro., MD Authorized by: Lilia Pro., MD   Critical care provider statement:    Critical care time (minutes):  35   Critical care was necessary  to treat or prevent imminent or life-threatening deterioration of the following conditions:  Respiratory failure   Critical care was time spent personally by me on the following activities:  Discussions with consultants, evaluation of patient's response to treatment, examination of patient, ordering and performing treatments and interventions, ordering and review of laboratory studies, ordering and review of radiographic studies, pulse oximetry, re-evaluation of patient's condition, obtaining history from patient or surrogate and review of old charts     Initial Impression / Assessment and Plan / ED Course  59 y.o. female who presents to the ED for epigastric abdominal pain, nausea and vomiting.  Also noted to be hypoxic with chronic shortness of breath  Ddx: gastritis, pancreatitis, alcohol induced gastritis/esophagitis.  With regards to her hypoxia, consider COVID, COPD exacerbation, pulmonary infection.  Will evaluate with labs, EKG, imaging.  Work-up with regards to patient's abdominal pain is largely unremarkable.  Mild hypokalemia, will replete.  Suspect likely alcohol-related gastritis/esophagitis.  CXR with changes consistent with acute  bronchitis, and possible bronchopneumonia of the right base.  Given this, will treat with antibiotics and steroids.  Given her new oxygen requirement, patient will require admission.  Initial screening COVID antigen negative, will proceed with PCR testing.  Remains on 5 L nasal cannula. Patient agreeable w/ plan. D/w hospitalist for admission.   Final Clinical Impression(s) / ED Diagnosis  Final diagnoses:  Epigastric abdominal pain  Hypoxia       Note:  This document was prepared using Dragon voice recognition software and may include unintentional dictation errors.   Lilia Pro., MD 08/16/19 2223

## 2019-08-16 NOTE — H&P (Signed)
History and Physical    Tracy Hood [id number] DOB: 05-May-1961 DOA: 08/16/2019  PCP: Perrin Maltese, MD  Patient coming from: Home  I have personally briefly reviewed patient's old medical records in [place]  Chief Complaint: Hypoxia  HPI: Tracy Hood is a 59 y.o. female with medical history significant for COPD not on supplemental O2, type 2 diabetes, hypertension, hyperlipidemia, and OSA on CPAP who presents to the ED for evaluation of epigastric abdominal discomfort, nausea with vomiting, and dyspnea.  Patient states she was in her usual state of health until after dinner when she began to have a bloating/indigestion sensation with upper abdominal discomfort.  She developed nausea and had 2 episodes of emesis with vomitus consisting of food content.  She reports chronic shortness of breath which has been worsening recently.  She denies any cough, subjective fevers, chills, diaphoresis, chest pain.  She reports a history of IBS with chronic loose stools.  She states she has about 3 bowel movements per day including today which is unchanged from her baseline.  She does not use oxygen at home routinely.  She uses a CPAP at night for sleep apnea.  She states her son who she lives with recently tested positive for COVID-19 about 3 days ago and did have symptoms of fatigue and loss of taste/smell.  ED Course:  Initial vitals showed BP 124/62, pulse 66, RR 23, temp 97.7 Fahrenheit, SPO2 91% on 5 L supplemental O2 via [place].  Labs are notable for WBC 9.1, hemoglobin 14.6, platelets 230,000, sodium 138, potassium 2.9, bicarb 25, BUN 10, creatinine 0.76, lipase 31, BNP 38, high-sensitivity troponin I 3.  POC SARS-CoV-2 antigen test is negative.  SARS-CoV-2 PCR panel is collected and pending.  Portable chest x-ray shows mildly prominent bronchovascular markings diffusely with central peribronchial thickening and streaky opacities at the right lung base.  No focal consolidation or  pleural effusions.  Patient was given IV Solu-Medrol 125 mg, IV azithromycin, and oral K 40 mEq.  The hospitalist service was consulted to admit for further evaluation and management.  Review of Systems: All systems reviewed and are negative except as documented in history of present illness above.   Past Medical History:  Diagnosis Date  . Anxiety   . Arthritis   . COPD (chronic obstructive pulmonary disease) ([place])   . Diabetes mellitus without complication ([place])    type 2  . Dyspnea   . Hypertension   . IBS (irritable bowel syndrome)   . Obesity   . Sleep apnea    STARTED USING CPAP 3 MONTHS AGO    Past Surgical History:  Procedure Laterality Date  . COLONOSCOPY WITH PROPOFOL N/A 12/05/2018   Procedure: COLONOSCOPY WITH PROPOFOL;  Surgeon: Lollie Sails, MD;  Location: [facility] ENDOSCOPY;  Service: Endoscopy;  Laterality: N/A;  . IR RADIOLOGIST EVAL & MGMT  02/05/2018  . JOINT REPLACEMENT Left 2018   tkr  . LAPAROSCOPIC APPENDECTOMY N/A 01/06/2019   Procedure: APPENDECTOMY LAPAROSCOPIC, DIABETIC;  Surgeon: Benjamine Sprague, DO;  Location: [facility] ORS;  Service: General;  Laterality: N/A;  . TUBAL LIGATION  1990    Social History:  reports that she quit smoking about 2 years ago. Her smoking use included cigarettes. She has never used smokeless tobacco. She reports current alcohol use of about 4.0 standard drinks of alcohol per week. She reports that she does not use drugs.  Allergies  Allergen Reactions  . Sulfa Antibiotics Itching    Family History  Problem Relation Age  of Onset  . Colon cancer Sister      Prior to Admission medications   Medication Sig Start Date End Date Taking? Authorizing Provider  albuterol (PROVENTIL HFA;VENTOLIN HFA) 108 (90 Base) MCG/ACT inhaler Inhale 2 puffs into the lungs every 6 (six) hours as needed for wheezing or shortness of breath. 04/09/18   Fisher, Linden Dolin, PA-C  amitriptyline (ELAVIL) 25 MG tablet Take 75 mg by mouth at bedtime.     [provider]  amLODipine (NORVASC) 10 MG tablet Take 10 mg by mouth daily.    [provider]  aspirin EC 81 MG tablet Take 1 tablet (81 mg total) by mouth daily. RESUME AFTER 48 HRS FROM SURGERY 01/06/19   Lysle Pearl, Isami, DO  budesonide-formoterol (SYMBICORT) 160-4.5 MCG/ACT inhaler Inhale 1 puff into the lungs 2 (two) times daily.     [provider]  dicyclomine (BENTYL) 20 MG tablet Take 20 mg by mouth 4 (four) times daily.     [provider]  empagliflozin (JARDIANCE) 25 MG TABS tablet Take 25 mg by mouth daily.    [provider]  ergocalciferol (VITAMIN D2) 1.25 MG (50000 UT) capsule Take 50,000 Units by mouth every Thursday.    [provider]  ibuprofen (ADVIL) 800 MG tablet Take 1 tablet (800 mg total) by mouth every 8 (eight) hours as needed for mild pain or moderate pain. 01/06/19   Lysle Pearl, Isami, DO  metFORMIN (GLUCOPHAGE-XR) 500 MG 24 hr tablet Take 1,000 mg by mouth daily with breakfast.    [provider]  Multiple Vitamins-Minerals (MULTIVITAMIN WOMEN 50+ PO) Take 1 tablet by mouth daily.    [provider]  rosuvastatin (CRESTOR) 40 MG tablet Take 40 mg by mouth daily.     [provider]    Physical Exam: Vitals:   08/16/19 1815 08/16/19 1824 08/16/19 1942 08/16/19 2030  BP: 124/62   (!) 160/91  Pulse: 66  68 73  Resp: (!) 23  17 19   Temp:  97.7 F (36.5 C)    TempSrc:  Oral    SpO2: 91%  98% 94%   Constitutional: Obese woman resting supine in bed, NAD, calm, comfortable Eyes: PERRL, lids and conjunctivae normal ENMT: Mucous membranes are moist. Posterior pharynx clear of any exudate or lesions..  Neck: normal, supple, no masses. Respiratory: Distant breath sounds without obvious wheezing or rales. Normal respiratory effort. No accessory muscle use.  Wearing 5 L supplemental O2 via [place]. Cardiovascular: Regular rate and rhythm, no murmurs / rubs / gallops. No extremity edema. 2+ pedal  pulses. Abdomen: Mild epigastric tenderness, no masses palpated. No hepatosplenomegaly. Bowel sounds positive.  Musculoskeletal: no clubbing / cyanosis. No joint deformity upper and lower extremities. Good ROM, no contractures. Normal muscle tone.  Skin: no rashes, lesions, ulcers. No induration Neurologic: CN 2-12 grossly intact. Sensation intact,Strength 5/5 in all 4.  Psychiatric: Normal judgment and insight. Alert and oriented x 3. Normal mood.     Labs on Admission: I have personally reviewed following labs and imaging studies  CBC: Recent Labs  Lab 08/16/19 1820  WBC 9.1  NEUTROABS 4.1  HGB 14.6  HCT 42.4  MCV 81.5  PLT 123456   Basic Metabolic Panel: Recent Labs  Lab 08/16/19 1820  NA 138  K 2.9*  CL 104  CO2 25  GLUCOSE 136*  BUN 10  CREATININE 0.76  CALCIUM 8.6*   GFR: CrCl cannot be calculated (Unknown ideal weight.). Liver Function Tests: Recent Labs  Lab 08/16/19  1820  AST 60*  ALT 27  ALKPHOS 77  BILITOT 0.9  PROT 7.2  ALBUMIN 3.7   Recent Labs  Lab 08/16/19 1820  LIPASE 31   No results for input(s): AMMONIA in the last 168 hours. Coagulation Profile: No results for input(s): INR, PROTIME in the last 168 hours. Cardiac Enzymes: No results for input(s): CKTOTAL, CKMB, CKMBINDEX, TROPONINI in the last 168 hours. BNP (last 3 results) No results for input(s): PROBNP in the last 8760 hours. HbA1C: No results for input(s): HGBA1C in the last 72 hours. CBG: No results for input(s): GLUCAP in the last 168 hours. Lipid Profile: No results for input(s): CHOL, HDL, LDLCALC, TRIG, CHOLHDL, LDLDIRECT in the last 72 hours. Thyroid Function Tests: No results for input(s): TSH, T4TOTAL, FREET4, T3FREE, THYROIDAB in the last 72 hours. Anemia Panel: No results for input(s): VITAMINB12, FOLATE, FERRITIN, TIBC, IRON, RETICCTPCT in the last 72 hours. Urine analysis:    Component Value Date/Time   COLORURINE YELLOW (A) 01/28/2018 1839   APPEARANCEUR CLEAR  (A) 01/28/2018 1839   LABSPEC 1.010 01/28/2018 1839   PHURINE 5.0 01/28/2018 1839   GLUCOSEU NEGATIVE 01/28/2018 1839   HGBUR SMALL (A) 01/28/2018 1839   BILIRUBINUR NEGATIVE 01/28/2018 1839   KETONESUR NEGATIVE 01/28/2018 1839   PROTEINUR NEGATIVE 01/28/2018 1839   NITRITE NEGATIVE 01/28/2018 1839   LEUKOCYTESUR NEGATIVE 01/28/2018 1839    Radiological Exams on Admission: DG Chest Port 1 View  Result Date: 08/16/2019 CLINICAL DATA:  COPD exacerbation with shortness of breath and hypoxia. Patient's son recently tested positive for COVID-19. EXAM: PORTABLE CHEST 1 VIEW COMPARISON:  04/09/2018 and earlier. FINDINGS: Cardiac silhouette upper normal in size to borderline enlarged for AP portable technique. Mildly prominent bronchovascular markings diffusely and moderate central peribronchial thickening, more so than on the prior examination. Streaky opacities at the RIGHT lung base medially. No confluent airspace consolidation. No visible pleural effusions. Pulmonary venous hypertension without evidence of pulmonary edema. IMPRESSION: 1. Moderate changes of acute bronchitis and/or asthma. 2. Streaky atelectasis versus bronchopneumonia involving the RIGHT lung base. Electronically Signed   By: Evangeline Dakin M.D.   On: 08/16/2019 18:45    EKG: Independently reviewed. Sinus rhythm without acute ischemic changes.  Similar to prior.  Assessment/Plan Principal Problem:   Acute respiratory failure with hypoxia (HCC) Active Problems:   Diabetes ([place])   Hypertension associated with diabetes ([place])   COPD with acute exacerbation ([place])   Hyperlipidemia associated with type 2 diabetes mellitus ([place])  Tracy Hood is a 59 y.o. female with medical history significant for COPD not on supplemental O2, type 2 diabetes, hypertension, hyperlipidemia, and OSA on CPAP who is admitted with acute respiratory failure with hypoxia.  Acute respiratory failure with hypoxia due to COPD exacerbation: Patient with  new oxygen requirement of 5 L supplemental O2 via [place].  Suspect due to underlying COPD.  She does have a recent close contact positive for COVID-19.  POC SARS-CoV-2 antigen is negative, PCR is pending. -Patient receiving IV Solu-Medrol 125 mg, continue with 40 mg twice daily tomorrow -Continue supplemental oxygen and wean down as able -Continue scheduled Combivent and as needed albuterol inhaler -Continue oral azithromycin -Incentive spirometer -SARS-CoV-2 PCR pending, if positive would start remdesivir.  If negative, will continue PUI precautions given timing of onset of symptoms and consider repeat testing in 24-48 hours if she remains in the hospital  Hypokalemia: Give additional oral supplement.  Check magnesium and replete if needed.  Abdominal discomfort/indigestion/IBS: Suspect gastritis/indigestion.  Lab work reassuring.  Doubt anginal equivalent with normal troponin and EKG.  Check KUB.  Continue antiemetics as needed and home Bentyl.  Hold NSAIDs.  Type 2 diabetes: Holding home Metformin empagliflozin while in hospital.  Continue sensitive SSI and check A1c.  Hypertension: Continue amlodipine.  Hyperlipidemia: Continue rosuvastatin  OSA on CPAP: Continue supplemental O2 at night, holding CPAP while PUI.  DVT prophylaxis: Lovenox Code Status: Full code, confirmed with patient Family Communication: Discussed with patient, she has discussed with him Disposition Plan: Likely discharge to home pending clinical progress Consults called: None Admission status: Observation   Zada Finders MD [facility]  If 7PM-7AM, please contact night-coverage [email or web address]  08/16/2019, 9:08 PM

## 2019-08-16 NOTE — ED Triage Notes (Signed)
Pt to ED with c/o of abdominal pain that started approx 2 hours ago. Pt states nausea with one occurrence of emesis in route to hospital. Pt's son recently tested positive for COVID. Pt denies diarrhea. Pt is SOB but states has hx of COPD.

## 2019-08-16 NOTE — ED Notes (Signed)
Pt states nausea has diminished.

## 2019-08-17 ENCOUNTER — Encounter: Payer: Self-pay | Admitting: Internal Medicine

## 2019-08-17 ENCOUNTER — Other Ambulatory Visit: Payer: Self-pay

## 2019-08-17 ENCOUNTER — Inpatient Hospital Stay: Payer: Medicaid Other

## 2019-08-17 DIAGNOSIS — J9621 Acute and chronic respiratory failure with hypoxia: Secondary | ICD-10-CM | POA: Diagnosis present

## 2019-08-17 DIAGNOSIS — E1169 Type 2 diabetes mellitus with other specified complication: Secondary | ICD-10-CM | POA: Diagnosis present

## 2019-08-17 DIAGNOSIS — J441 Chronic obstructive pulmonary disease with (acute) exacerbation: Secondary | ICD-10-CM | POA: Diagnosis present

## 2019-08-17 DIAGNOSIS — Z882 Allergy status to sulfonamides status: Secondary | ICD-10-CM | POA: Diagnosis not present

## 2019-08-17 DIAGNOSIS — E785 Hyperlipidemia, unspecified: Secondary | ICD-10-CM | POA: Diagnosis present

## 2019-08-17 DIAGNOSIS — J9601 Acute respiratory failure with hypoxia: Secondary | ICD-10-CM

## 2019-08-17 DIAGNOSIS — F419 Anxiety disorder, unspecified: Secondary | ICD-10-CM | POA: Diagnosis present

## 2019-08-17 DIAGNOSIS — Z7982 Long term (current) use of aspirin: Secondary | ICD-10-CM | POA: Diagnosis not present

## 2019-08-17 DIAGNOSIS — I10 Essential (primary) hypertension: Secondary | ICD-10-CM

## 2019-08-17 DIAGNOSIS — J189 Pneumonia, unspecified organism: Secondary | ICD-10-CM | POA: Diagnosis not present

## 2019-08-17 DIAGNOSIS — E1165 Type 2 diabetes mellitus with hyperglycemia: Secondary | ICD-10-CM | POA: Diagnosis present

## 2019-08-17 DIAGNOSIS — E669 Obesity, unspecified: Secondary | ICD-10-CM | POA: Diagnosis present

## 2019-08-17 DIAGNOSIS — Z87891 Personal history of nicotine dependence: Secondary | ICD-10-CM | POA: Diagnosis not present

## 2019-08-17 DIAGNOSIS — E1159 Type 2 diabetes mellitus with other circulatory complications: Secondary | ICD-10-CM | POA: Diagnosis not present

## 2019-08-17 DIAGNOSIS — R1013 Epigastric pain: Secondary | ICD-10-CM | POA: Diagnosis present

## 2019-08-17 DIAGNOSIS — T380X5A Adverse effect of glucocorticoids and synthetic analogues, initial encounter: Secondary | ICD-10-CM | POA: Diagnosis present

## 2019-08-17 DIAGNOSIS — Z9981 Dependence on supplemental oxygen: Secondary | ICD-10-CM | POA: Diagnosis not present

## 2019-08-17 DIAGNOSIS — Z7984 Long term (current) use of oral hypoglycemic drugs: Secondary | ICD-10-CM | POA: Diagnosis not present

## 2019-08-17 DIAGNOSIS — E876 Hypokalemia: Secondary | ICD-10-CM | POA: Diagnosis present

## 2019-08-17 DIAGNOSIS — Z7951 Long term (current) use of inhaled steroids: Secondary | ICD-10-CM | POA: Diagnosis not present

## 2019-08-17 DIAGNOSIS — Z20822 Contact with and (suspected) exposure to covid-19: Secondary | ICD-10-CM | POA: Diagnosis present

## 2019-08-17 DIAGNOSIS — I152 Hypertension secondary to endocrine disorders: Secondary | ICD-10-CM | POA: Diagnosis present

## 2019-08-17 DIAGNOSIS — Z79899 Other long term (current) drug therapy: Secondary | ICD-10-CM | POA: Diagnosis not present

## 2019-08-17 DIAGNOSIS — J440 Chronic obstructive pulmonary disease with acute lower respiratory infection: Secondary | ICD-10-CM | POA: Diagnosis present

## 2019-08-17 DIAGNOSIS — Z6827 Body mass index (BMI) 27.0-27.9, adult: Secondary | ICD-10-CM | POA: Diagnosis not present

## 2019-08-17 DIAGNOSIS — G4733 Obstructive sleep apnea (adult) (pediatric): Secondary | ICD-10-CM | POA: Diagnosis present

## 2019-08-17 DIAGNOSIS — K589 Irritable bowel syndrome without diarrhea: Secondary | ICD-10-CM | POA: Diagnosis present

## 2019-08-17 HISTORY — DX: Acute respiratory failure with hypoxia: J96.01

## 2019-08-17 LAB — GLUCOSE, CAPILLARY
Glucose-Capillary: 159 mg/dL — ABNORMAL HIGH (ref 70–99)
Glucose-Capillary: 188 mg/dL — ABNORMAL HIGH (ref 70–99)
Glucose-Capillary: 179 mg/dL — ABNORMAL HIGH (ref 70–99)
Glucose-Capillary: 271 mg/dL — ABNORMAL HIGH (ref 70–99)

## 2019-08-17 LAB — BASIC METABOLIC PANEL
Anion gap: 11 (ref 5–15)
BUN: 11 mg/dL (ref 6–20)
CO2: 21 mmol/L — ABNORMAL LOW (ref 22–32)
Calcium: 8.3 mg/dL — ABNORMAL LOW (ref 8.9–10.3)
Chloride: 103 mmol/L (ref 98–111)
Creatinine, Ser: 0.57 mg/dL (ref 0.44–1.00)
GFR calc Af Amer: >60 mL/min (ref >60)
GFR calc non Af Amer: >60 mL/min (ref >60)
Glucose, Bld: 152 mg/dL — ABNORMAL HIGH (ref 70–99)
Potassium: 4.8 mmol/L (ref 3.5–5.1)
Sodium: 135 mmol/L (ref 135–145)

## 2019-08-17 LAB — CBC
HCT: 42.4 % (ref 36.0–46.0)
Hemoglobin: 14.4 g/dL (ref 12.0–15.0)
MCH: 27.2 pg (ref 26.0–34.0)
MCHC: 34 g/dL (ref 30.0–36.0)
MCV: 80.2 fL (ref 80.0–100.0)
Platelets: 221 10*3/uL (ref 150–400)
RBC: 5.29 MIL/uL — ABNORMAL HIGH (ref 3.87–5.11)
RDW: 13.7 % (ref 11.5–15.5)
WBC: 8.5 10*3/uL (ref 4.0–10.5)
nRBC: 0 % (ref 0.0–0.2)

## 2019-08-17 LAB — C-REACTIVE PROTEIN: CRP: 0.7 mg/dL (ref <1.0)

## 2019-08-17 LAB — PROCALCITONIN: Procalcitonin: <0.1 ng/mL

## 2019-08-17 LAB — TROPONIN I (HIGH SENSITIVITY): Troponin I (High Sensitivity): 6 ng/L (ref <18)

## 2019-08-17 LAB — FERRITIN: Ferritin: 104 ng/mL (ref 11–307)

## 2019-08-17 LAB — FIBRIN DERIVATIVES D-DIMER (ARMC ONLY): Fibrin derivatives D-dimer (ARMC): 1533.87 ng/mL (FEU) — ABNORMAL HIGH (ref 0.00–499.00)

## 2019-08-17 LAB — HIV ANTIBODY (ROUTINE TESTING W REFLEX): HIV Screen 4th Generation wRfx: NONREACTIVE

## 2019-08-17 LAB — LACTATE DEHYDROGENASE: LDH: 131 U/L (ref 98–192)

## 2019-08-17 MED ORDER — MOMETASONE FURO-FORMOTEROL FUM 200-5 MCG/ACT IN AERO
2.0000 | INHALATION_SPRAY | Freq: Two times a day (BID) | RESPIRATORY_TRACT | Status: DC
  Administered 2019-08-17 – 2019-08-19 (×4): 2 via RESPIRATORY_TRACT
  Filled 2019-08-17: qty 8.8

## 2019-08-17 MED ORDER — CHLORHEXIDINE GLUCONATE CLOTH 2 % EX PADS
6.0000 | MEDICATED_PAD | Freq: Every day | CUTANEOUS | Status: DC
  Administered 2019-08-17 – 2019-08-18 (×2): 6 via TOPICAL

## 2019-08-17 MED ORDER — TIOTROPIUM BROMIDE MONOHYDRATE 18 MCG IN CAPS
18.0000 ug | ORAL_CAPSULE | Freq: Every day | RESPIRATORY_TRACT | Status: DC
  Administered 2019-08-17 – 2019-08-19 (×3): 18 ug via RESPIRATORY_TRACT
  Filled 2019-08-17: qty 5

## 2019-08-17 MED ORDER — IOHEXOL 350 MG/ML SOLN
75.0000 mL | Freq: Once | INTRAVENOUS | Status: AC | PRN
  Administered 2019-08-17: 22:00:00 75 mL via INTRAVENOUS

## 2019-08-17 MED ORDER — PIPERACILLIN-TAZOBACTAM 3.375 G IVPB
3.3750 g | Freq: Three times a day (TID) | INTRAVENOUS | Status: DC
  Administered 2019-08-17 – 2019-08-18 (×2): 3.375 g via INTRAVENOUS
  Filled 2019-08-17 (×2): qty 50

## 2019-08-17 MED ORDER — ELUXADOLINE 100 MG PO TABS: 1.0000 | ORAL_TABLET | Freq: Two times a day (BID) | ORAL | Status: DC

## 2019-08-17 NOTE — Progress Notes (Signed)
Pharmacy Antibiotic Note  Tracy Hood is a 59 y.o. female admitted on 08/16/2019 with pneumonia.  Pharmacy has been consulted for Zosyn dosing. Hx COPD on 02, DM. Pt's Son tested + Covid 4 days PTA -pt also on Azithromycin  Plan: Zosyn 3.375g IV q8h (4 hour infusion).     Height: 5\' 7"  (170.2 cm) Weight: 177 lb 1.6 oz (80.3 kg) IBW/kg (Calculated) : 61.6  Temp (24hrs), Avg:98 F (36.7 C), Min:97.7 F (36.5 C), Max:98.3 F (36.8 C)  Recent Labs  Lab 08/16/19 1820 08/17/19 0008  WBC 9.1 8.5  CREATININE 0.76 0.57    Estimated Creatinine Clearance: 85.7 mL/min (by C-G formula based on SCr of 0.57 mg/dL).    Allergies  Allergen Reactions  . Sulfa Antibiotics Itching    Antimicrobials this admission: 2/8 Zosyn >>   2/7 Aziithromycin >>    Dose adjustments this admission:    Microbiology results:   BCx:     UCx:      Sputum:      MRSA PCR:   2/7 Covid PCR negative  Thank you for allowing pharmacy to be a part of this patient's care.  Joanathan Affeldt A 08/17/2019 4:04 PM

## 2019-08-17 NOTE — Plan of Care (Signed)
  Problem: Education: Goal: Knowledge of General Education information will improve Description Including pain rating scale, medication(s)/side effects and non-pharmacologic comfort measures Outcome: Progressing   Problem: Health Behavior/Discharge Planning: Goal: Ability to manage health-related needs will improve Outcome: Progressing   

## 2019-08-17 NOTE — Plan of Care (Signed)
  Problem: Education: Goal: Knowledge of General Education information will improve Description: Including pain rating scale, medication(s)/side effects and non-pharmacologic comfort measures Outcome: Progressing   Problem: Health Behavior/Discharge Planning: Goal: Ability to manage health-related needs will improve Outcome: Progressing   Problem: Clinical Measurements: Goal: Ability to maintain clinical measurements within normal limits will improve Outcome: Not Progressing Note: Patient's liver enzymes, most recently, are elevated. Admitted with abdominal pain. Will continue to monitor GI status and labs for the remainder of the shift. Wenda Low [facility]

## 2019-08-17 NOTE — Progress Notes (Addendum)
Progress Note    Tracy Hood  [id number] DOB: Mar 23, 1961  DOA: 08/16/2019 PCP: Perrin Maltese, MD      Brief Narrative:    Medical records reviewed and are as summarized below:  Tracy Hood is an 59 y.o. female with medical history significant for COPD, chronic hypoxemic respiratory failure with intermittent use of 1 L/min oxygen at night, OSA on CPAP at night, type II DM, hypertension, hyperlipidemia, diarrhea predominant IBS.  She presented to the hospital because of epigastric discomfort, nausea, vomiting and increasing shortness of breath.  She said she usually has shortness of breath with exertion but this is slightly increased.  Her son recently tested positive for coronavirus about 4 days prior to admission. Patient was hypoxemic in the ED with oxygen saturation of 91% on 5 L/min oxygen via nasal cannula.  Her Covid test was negative.  Chest x-ray showed (1) moderate changes of acute bronchitis and/or asthma and (2) streaky atelectasis versus bronchopneumonia involving the right lung base     Assessment/Plan:   Principal Problem:   Acute respiratory failure with hypoxia (HCC) Active Problems:   Diabetes ([place])   Hypertension associated with diabetes ([place])   COPD with acute exacerbation (HCC)   Hyperlipidemia associated with type 2 diabetes mellitus ([place])   Acute hypoxemic respiratory failure ([place])   Community acquired pneumonia   Acute on chronic hypoxemic respiratory failure: Patient is requiring 5 L/min oxygen per nasal cannula which is way above her baseline (1 L/min).  Pneumonia: Start IV Zosyn.  Continue azithromycin.  Repeat Covid test because of strong suspicion for coronavirus infection since she lives with her son who is Covid positive.  Check inflammatory markers.  Probable COPD exacerbation: Continue steroids and bronchodilators.  Hypertension: Continue antihypertensives  Type 2 diabetes mellitus: Semaglutide on hold because it is nonformulary.   Use NovoLog as needed for hyperglycemia.  Hypokalemia: Improved   Body mass index is 27.74 kg/m.   Family Communication/Anticipated D/C date and plan/Code Status   DVT prophylaxis: Lovenox Code Status: Full code Family Communication: Plan discussed with patient  disposition Plan: To be determined      Subjective:   She feels a little better today.  No vomiting today.  Abdominal pain is better.  She has an occasional cough.  No shortness of breath.  Objective:    Vitals:   08/17/19 0452 08/17/19 0826 08/17/19 1213 08/17/19 1303  BP: (!) 154/78 (!) 167/90 (!) 166/85   Pulse: 75 (!) 59 70   Resp: 20 18 16    Temp: 98.3 F (36.8 C) 97.9 F (36.6 C) 98.2 F (36.8 C)   TempSrc: Oral Oral Oral   SpO2: 97% 96% 97%   Weight: 80.3 kg     Height:    5\' 7"  (1.702 m)    Intake/Output Summary (Last 24 hours) at 08/17/2019 1618 Last data filed at 08/17/2019 1358 Gross per 24 hour  Intake --  Output 700 ml  Net -700 ml   Filed Weights   08/16/19 2302 08/17/19 0452  Weight: 80.3 kg 80.3 kg    Exam:  GEN: NAD SKIN: No rash EYES: EOMI ENT: MMM CV: RRR PULM: CTA B ABD: soft, obese, NT, +BS CNS: AAO x 3, non focal EXT: No edema or tenderness   Data Reviewed:   I have personally reviewed following labs and imaging studies:  Labs: Labs show the following:   Basic Metabolic Panel: Recent Labs  Lab 08/16/19 1820 08/17/19 0008  NA 138 135  K 2.9* 4.8  CL 104 103  CO2 25 21*  GLUCOSE 136* 152*  BUN 10 11  CREATININE 0.76 0.57  CALCIUM 8.6* 8.3*  MG 1.8  --    GFR Estimated Creatinine Clearance: 85.7 mL/min (by C-G formula based on SCr of 0.57 mg/dL). Liver Function Tests: Recent Labs  Lab 08/16/19 1820  AST 60*  ALT 27  ALKPHOS 77  BILITOT 0.9  PROT 7.2  ALBUMIN 3.7   Recent Labs  Lab 08/16/19 1820  LIPASE 31   No results for input(s): AMMONIA in the last 168 hours. Coagulation profile No results for input(s): INR, PROTIME in the last 168  hours.  CBC: Recent Labs  Lab 08/16/19 1820 08/17/19 0008  WBC 9.1 8.5  NEUTROABS 4.1  --   HGB 14.6 14.4  HCT 42.4 42.4  MCV 81.5 80.2  PLT 230 221   Cardiac Enzymes: No results for input(s): CKTOTAL, CKMB, CKMBINDEX, TROPONINI in the last 168 hours. BNP (last 3 results) No results for input(s): PROBNP in the last 8760 hours. CBG: Recent Labs  Lab 08/16/19 2255 08/17/19 0823 08/17/19 1214  GLUCAP 149* 159* 188*   D-Dimer: No results for input(s): DDIMER in the last 72 hours. Hgb A1c: No results for input(s): HGBA1C in the last 72 hours. Lipid Profile: No results for input(s): CHOL, HDL, LDLCALC, TRIG, CHOLHDL, LDLDIRECT in the last 72 hours. Thyroid function studies: No results for input(s): TSH, T4TOTAL, T3FREE, THYROIDAB in the last 72 hours.  Invalid input(s): FREET3 Anemia work up: No results for input(s): VITAMINB12, FOLATE, FERRITIN, TIBC, IRON, RETICCTPCT in the last 72 hours. Sepsis Labs: Recent Labs  Lab 08/16/19 1820 08/17/19 0008  PROCALCITON <0.10  --   WBC 9.1 8.5    Microbiology Recent Results (from the past 240 hour(s))  Respiratory Panel by RT PCR (Flu A&B, Covid) - Nasopharyngeal Swab     Status: None   Collection Time: 08/16/19  8:19 PM   Specimen: Nasopharyngeal Swab  Result Value Ref Range Status   SARS Coronavirus 2 by RT PCR NEGATIVE NEGATIVE Final    Comment: (NOTE) SARS-CoV-2 target nucleic acids are NOT DETECTED. The SARS-CoV-2 RNA is generally detectable in upper respiratoy specimens during the acute phase of infection. The lowest concentration of SARS-CoV-2 viral copies this assay can detect is 131 copies/mL. A negative result does not preclude SARS-Cov-2 infection and should not be used as the sole basis for treatment or other patient management decisions. A negative result may occur with  improper specimen collection/handling, submission of specimen other than nasopharyngeal swab, presence of viral mutation(s) within  the areas targeted by this assay, and inadequate number of viral copies (<131 copies/mL). A negative result must be combined with clinical observations, patient history, and epidemiological information. The expected result is Negative. Fact Sheet for Patients:  PinkCheek.be Fact Sheet for Healthcare Providers:  GravelBags.it This test is not yet ap proved or cleared by the [place] FDA and  has been authorized for detection and/or diagnosis of SARS-CoV-2 by FDA under an Emergency Use Authorization (EUA). This EUA will remain  in effect (meaning this test can be used) for the duration of the COVID-19 declaration under Section 564(b)(1) of the Act, 21 U.S.C. section 360bbb-3(b)(1), unless the authorization is terminated or revoked sooner.    Influenza A by PCR NEGATIVE NEGATIVE Final   Influenza B by PCR NEGATIVE NEGATIVE Final    Comment: (NOTE) The Xpert Xpress SARS-CoV-2/FLU/RSV assay is intended as an aid in  the diagnosis  of influenza from Nasopharyngeal swab specimens and  should not be used as a sole basis for treatment. Nasal washings and  aspirates are unacceptable for Xpert Xpress SARS-CoV-2/FLU/RSV  testing. Fact Sheet for Patients: PinkCheek.be Fact Sheet for Healthcare Providers: GravelBags.it This test is not yet approved or cleared by the [place] FDA and  has been authorized for detection and/or diagnosis of SARS-CoV-2 by  FDA under an Emergency Use Authorization (EUA). This EUA will remain  in effect (meaning this test can be used) for the duration of the  Covid-19 declaration under Section 564(b)(1) of the Act, 21  U.S.C. section 360bbb-3(b)(1), unless the authorization is  terminated or revoked. Performed at [facility], [address] [place], [place] [address]     Procedures and diagnostic studies:  Abd 1 View  (KUB)  Result Date: 08/16/2019 CLINICAL DATA:  Abdominal pain EXAM: ABDOMEN - 1 VIEW COMPARISON:  None. FINDINGS: The bowel gas pattern is normal. No radio-opaque calculi or other significant radiographic abnormality are seen. IMPRESSION: No acute findings Electronically Signed   By: Rolm Baptise M.D.   On: 08/16/2019 21:19   DG Chest Port 1 View  Result Date: 08/16/2019 CLINICAL DATA:  COPD exacerbation with shortness of breath and hypoxia. Patient's son recently tested positive for COVID-19. EXAM: PORTABLE CHEST 1 VIEW COMPARISON:  04/09/2018 and earlier. FINDINGS: Cardiac silhouette upper normal in size to borderline enlarged for AP portable technique. Mildly prominent bronchovascular markings diffusely and moderate central peribronchial thickening, more so than on the prior examination. Streaky opacities at the RIGHT lung base medially. No confluent airspace consolidation. No visible pleural effusions. Pulmonary venous hypertension without evidence of pulmonary edema. IMPRESSION: 1. Moderate changes of acute bronchitis and/or asthma. 2. Streaky atelectasis versus bronchopneumonia involving the RIGHT lung base. Electronically Signed   By: Evangeline Dakin M.D.   On: 08/16/2019 18:45    Medications:   . amitriptyline  75 mg Oral QHS  . amLODipine  10 mg Oral Daily  . azithromycin  250 mg Oral Daily  . Chlorhexidine Gluconate Cloth  6 each Topical Daily  . dicyclomine  20 mg Oral QID  . Eluxadoline  1 tablet Oral BID  . enoxaparin (LOVENOX) injection  40 mg Subcutaneous Q24H  . guaiFENesin  600 mg Oral BID  . insulin aspart  0-9 Units Subcutaneous TID WC  . methylPREDNISolone (SOLU-MEDROL) injection  40 mg Intravenous Q12H  . mometasone-formoterol  2 puff Inhalation BID  . rosuvastatin  40 mg Oral q1800  . tiotropium  18 mcg Inhalation Daily   Continuous Infusions: . piperacillin-tazobactam (ZOSYN)  IV       LOS: 0 days   Lyall Faciane  [facility]     08/17/2019, 4:18 PM

## 2019-08-18 ENCOUNTER — Inpatient Hospital Stay: Payer: Medicaid Other

## 2019-08-18 LAB — HEMOGLOBIN A1C
Hgb A1c MFr Bld: 6.6 % — ABNORMAL HIGH (ref 4.8–5.6)
Mean Plasma Glucose: 143 mg/dL

## 2019-08-18 LAB — GLUCOSE, CAPILLARY
Glucose-Capillary: 189 mg/dL — ABNORMAL HIGH (ref 70–99)
Glucose-Capillary: 274 mg/dL — ABNORMAL HIGH (ref 70–99)
Glucose-Capillary: 183 mg/dL — ABNORMAL HIGH (ref 70–99)
Glucose-Capillary: 294 mg/dL — ABNORMAL HIGH (ref 70–99)

## 2019-08-18 LAB — SARS CORONAVIRUS 2 (TAT 6-24 HRS): SARS Coronavirus 2: NEGATIVE

## 2019-08-18 MED ORDER — INSULIN GLARGINE 100 UNIT/ML ~~LOC~~ SOLN
15.0000 [IU] | Freq: Every day | SUBCUTANEOUS | Status: DC
  Administered 2019-08-18: 22:00:00 15 [IU] via SUBCUTANEOUS
  Filled 2019-08-18 (×2): qty 0.15

## 2019-08-18 NOTE — Progress Notes (Signed)
O2 decreased to 2L

## 2019-08-18 NOTE — Progress Notes (Signed)
O2 Sat 96% on 3L/[place]

## 2019-08-18 NOTE — Progress Notes (Signed)
Progress Note    Tracy Hood  [id number] DOB: Aug 09, 1960  DOA: 08/16/2019 PCP: Perrin Maltese, MD      Brief Narrative:    Medical records reviewed and are as summarized below:  Tracy Hood is an 59 y.o. female with medical history significant for COPD, chronic hypoxemic respiratory failure with intermittent use of 1 L/min oxygen at night, OSA on CPAP at night, type II DM, hypertension, hyperlipidemia, diarrhea predominant IBS.  She presented to the hospital because of epigastric discomfort, nausea, vomiting and increasing shortness of breath.  She said she usually has shortness of breath with exertion but this is slightly increased.  Her son recently tested positive for coronavirus about 4 days prior to admission. Patient was hypoxemic in the ED with oxygen saturation of 91% on 5 L/min oxygen via nasal cannula.  Her Covid test was negative.  Chest x-ray showed (1) moderate changes of acute bronchitis and/or asthma and (2) streaky atelectasis versus bronchopneumonia involving the right lung base     Assessment/Plan:   Principal Problem:   Acute respiratory failure with hypoxia (HCC) Active Problems:   Diabetes ([place])   Hypertension associated with diabetes ([place])   COPD with acute exacerbation (HCC)   Hyperlipidemia associated with type 2 diabetes mellitus ([place])   Acute hypoxemic respiratory failure (HCC)   Acute on chronic hypoxemic respiratory failure: Patient is down to 3 L/min from 5 L/min oxygen per nasal cannula.  D-dimer was significantly elevated (1,533).  CTA of the chest did not show any pneumonia or pulmonary embolism  No evidence of pneumonia on CT scan of the chest:  Discontinue IV Zosyn. Continue azithromycin.  Repeat Covid test was negative.  Because patient's son tested positive, patient will remain on isolation for now to protect medical staff in case Covid test is a false negative test..  Probable COPD exacerbation: Continue azithromycin, steroids  and bronchodilators.  Hypertension: Continue antihypertensives  Type 2 diabetes mellitus: Hemoglobin A1c is 6.6.  Semaglutide on hold because it is nonformulary.  Start Lantus for hyperglycemia likely induced by steroids.  Use NovoLog as needed for hyperglycemia.  Hypokalemia: Improved   Body mass index is 27.74 kg/m.   Family Communication/Anticipated D/C date and plan/Code Status   DVT prophylaxis: Lovenox Code Status: Full code Family Communication: Plan discussed with patient  disposition Plan: Possible discharge to home in 1 to 2 days if hypoxemia improves.      Subjective:   She feels a little better today.  No vomiting today.  Abdominal pain is better.  She has an occasional cough.  No shortness of breath.  Objective:    Vitals:   08/18/19 0741 08/18/19 1151 08/18/19 1239 08/18/19 1543  BP: (!) 168/79 (!) 168/84  (!) 158/72  Pulse: 64 67  66  Resp: 18 18  18   Temp: 97.7 F (36.5 C)     TempSrc: Oral     SpO2: 100% 97% 96% 97%  Weight:      Height:        Intake/Output Summary (Last 24 hours) at 08/18/2019 1641 Last data filed at 08/18/2019 1152 Gross per 24 hour  Intake --  Output 900 ml  Net -900 ml   Filed Weights   08/16/19 2302 08/17/19 0452  Weight: 80.3 kg 80.3 kg    Exam:  GEN: NAD SKIN: No rash EYES: EOMI ENT: MMM CV: RRR PULM: CTA B ABD: soft, obese, NT, +BS CNS: AAO x 3, non focal EXT: No edema or  tenderness   Data Reviewed:   I have personally reviewed following labs and imaging studies:  Labs: Labs show the following:   Basic Metabolic Panel: Recent Labs  Lab 08/16/19 1820 08/17/19 0008  NA 138 135  K 2.9* 4.8  CL 104 103  CO2 25 21*  GLUCOSE 136* 152*  BUN 10 11  CREATININE 0.76 0.57  CALCIUM 8.6* 8.3*  MG 1.8  --    GFR Estimated Creatinine Clearance: 85.7 mL/min (by C-G formula based on SCr of 0.57 mg/dL). Liver Function Tests: Recent Labs  Lab 08/16/19 1820  AST 60*  ALT 27  ALKPHOS 77  BILITOT 0.9   PROT 7.2  ALBUMIN 3.7   Recent Labs  Lab 08/16/19 1820  LIPASE 31   No results for input(s): AMMONIA in the last 168 hours. Coagulation profile No results for input(s): INR, PROTIME in the last 168 hours.  CBC: Recent Labs  Lab 08/16/19 1820 08/17/19 0008  WBC 9.1 8.5  NEUTROABS 4.1  --   HGB 14.6 14.4  HCT 42.4 42.4  MCV 81.5 80.2  PLT 230 221   Cardiac Enzymes: No results for input(s): CKTOTAL, CKMB, CKMBINDEX, TROPONINI in the last 168 hours. BNP (last 3 results) No results for input(s): PROBNP in the last 8760 hours. CBG: Recent Labs  Lab 08/17/19 1214 08/17/19 1700 08/17/19 2100 08/18/19 0743 08/18/19 1149  GLUCAP 188* 179* 271* 189* 274*   D-Dimer: No results for input(s): DDIMER in the last 72 hours. Hgb A1c: Recent Labs    08/17/19 0008  HGBA1C 6.6*   Lipid Profile: No results for input(s): CHOL, HDL, LDLCALC, TRIG, CHOLHDL, LDLDIRECT in the last 72 hours. Thyroid function studies: No results for input(s): TSH, T4TOTAL, T3FREE, THYROIDAB in the last 72 hours.  Invalid input(s): FREET3 Anemia work up: Recent Labs    08/17/19 1708  FERRITIN 104   Sepsis Labs: Recent Labs  Lab 08/16/19 1820 08/17/19 0008  PROCALCITON <0.10  --   WBC 9.1 8.5    Microbiology Recent Results (from the past 240 hour(s))  Respiratory Panel by RT PCR (Flu A&B, Covid) - Nasopharyngeal Swab     Status: None   Collection Time: 08/16/19  8:19 PM   Specimen: Nasopharyngeal Swab  Result Value Ref Range Status   SARS Coronavirus 2 by RT PCR NEGATIVE NEGATIVE Final    Comment: (NOTE) SARS-CoV-2 target nucleic acids are NOT DETECTED. The SARS-CoV-2 RNA is generally detectable in upper respiratoy specimens during the acute phase of infection. The lowest concentration of SARS-CoV-2 viral copies this assay can detect is 131 copies/mL. A negative result does not preclude SARS-Cov-2 infection and should not be used as the sole basis for treatment or other patient  management decisions. A negative result may occur with  improper specimen collection/handling, submission of specimen other than nasopharyngeal swab, presence of viral mutation(s) within the areas targeted by this assay, and inadequate number of viral copies (<131 copies/mL). A negative result must be combined with clinical observations, patient history, and epidemiological information. The expected result is Negative. Fact Sheet for Patients:  PinkCheek.be Fact Sheet for Healthcare Providers:  GravelBags.it This test is not yet ap proved or cleared by the [place] FDA and  has been authorized for detection and/or diagnosis of SARS-CoV-2 by FDA under an Emergency Use Authorization (EUA). This EUA will remain  in effect (meaning this test can be used) for the duration of the COVID-19 declaration under Section 564(b)(1) of the Act, 21 U.S.C. section 360bbb-3(b)(1), unless  the authorization is terminated or revoked sooner.    Influenza A by PCR NEGATIVE NEGATIVE Final   Influenza B by PCR NEGATIVE NEGATIVE Final    Comment: (NOTE) The Xpert Xpress SARS-CoV-2/FLU/RSV assay is intended as an aid in  the diagnosis of influenza from Nasopharyngeal swab specimens and  should not be used as a sole basis for treatment. Nasal washings and  aspirates are unacceptable for Xpert Xpress SARS-CoV-2/FLU/RSV  testing. Fact Sheet for Patients: PinkCheek.be Fact Sheet for Healthcare Providers: GravelBags.it This test is not yet approved or cleared by the [place] FDA and  has been authorized for detection and/or diagnosis of SARS-CoV-2 by  FDA under an Emergency Use Authorization (EUA). This EUA will remain  in effect (meaning this test can be used) for the duration of the  Covid-19 declaration under Section 564(b)(1) of the Act, 21  U.S.C. section 360bbb-3(b)(1), unless the  authorization is  terminated or revoked. Performed at [facility], [place], [place] [address]   SARS CORONAVIRUS 2 (TAT 6-24 HRS) Nasopharyngeal Nasopharyngeal Swab     Status: None   Collection Time: 08/17/19  5:43 PM   Specimen: Nasopharyngeal Swab  Result Value Ref Range Status   SARS Coronavirus 2 NEGATIVE NEGATIVE Final    Comment: (NOTE) SARS-CoV-2 target nucleic acids are NOT DETECTED. The SARS-CoV-2 RNA is generally detectable in upper and lower respiratory specimens during the acute phase of infection. Negative results do not preclude SARS-CoV-2 infection, do not rule out co-infections with other pathogens, and should not be used as the sole basis for treatment or other patient management decisions. Negative results must be combined with clinical observations, patient history, and epidemiological information. The expected result is Negative. Fact Sheet for Patients: SugarRoll.be Fact Sheet for Healthcare Providers: https://www.woods-mathews.com/ This test is not yet approved or cleared by the [place] FDA and  has been authorized for detection and/or diagnosis of SARS-CoV-2 by FDA under an Emergency Use Authorization (EUA). This EUA will remain  in effect (meaning this test can be used) for the duration of the COVID-19 declaration under Section 56 4(b)(1) of the Act, 21 U.S.C. section 360bbb-3(b)(1), unless the authorization is terminated or revoked sooner. Performed at [facility], [place] [address] [place],  [address]     Procedures and diagnostic studies:  Abd 1 View (KUB)  Result Date: 08/16/2019 CLINICAL DATA:  Abdominal pain EXAM: ABDOMEN - 1 VIEW COMPARISON:  None. FINDINGS: The bowel gas pattern is normal. No radio-opaque calculi or other significant radiographic abnormality are seen. IMPRESSION: No acute findings Electronically Signed   By: Rolm Baptise M.D.   On: 08/16/2019  21:19   CT ANGIO CHEST PE W OR WO CONTRAST  Result Date: 08/17/2019 CLINICAL DATA:  59 year old female with hypoxia. Concern for pulmonary embolism. EXAM: CT ANGIOGRAPHY CHEST WITH CONTRAST TECHNIQUE: Multidetector CT imaging of the chest was performed using the standard protocol during bolus administration of intravenous contrast. Multiplanar CT image reconstructions and MIPs were obtained to evaluate the vascular anatomy. CONTRAST:  26mL OMNIPAQUE IOHEXOL 350 MG/ML SOLN COMPARISON:  Chest radiograph dated 08/16/2019. FINDINGS: Cardiovascular: There is no cardiomegaly or pericardial effusion. Coronary vascular calcification and partially calcified mitral annulus. There is mild atherosclerotic calcification of the thoracic aorta. No aneurysmal dilatation or dissection. The origins of the great vessels of the aortic arch appear patent. Evaluation of the pulmonary arteries is limited due to respiratory motion artifact. No large central pulmonary artery embolus identified. Mediastinum/Nodes: There is no  hilar or mediastinal adenopathy. The esophagus is grossly unremarkable. The thyroid gland is mildly enlarged and lobulated with probable nodules. Further evaluation with ultrasound on a nonemergent/outpatient basis recommended. No mediastinal fluid collection. Lungs/Pleura: There is mild chronic interstitial coarsening and bronchitic changes. Bibasilar linear atelectasis/scarring noted. No focal consolidation, pleural effusion, pneumothorax. The central airways are patent. Upper Abdomen: Mild irregularity of the liver contour concerning for early changes of cirrhosis. Clinical correlation is recommended. Musculoskeletal: Degenerative changes of the spine. No acute osseous pathology. Review of the MIP images confirms the above findings. IMPRESSION: 1. No acute intrathoracic pathology. No CT evidence of central pulmonary artery embolus. 2. Chronic bronchitic changes. 3. Coronary vascular calcification and Aortic  Atherosclerosis (ICD10-I70.0). Electronically Signed   By: Anner Crete M.D.   On: 08/17/2019 22:14   DG Chest Port 1 View  Result Date: 08/16/2019 CLINICAL DATA:  COPD exacerbation with shortness of breath and hypoxia. Patient's son recently tested positive for COVID-19. EXAM: PORTABLE CHEST 1 VIEW COMPARISON:  04/09/2018 and earlier. FINDINGS: Cardiac silhouette upper normal in size to borderline enlarged for AP portable technique. Mildly prominent bronchovascular markings diffusely and moderate central peribronchial thickening, more so than on the prior examination. Streaky opacities at the RIGHT lung base medially. No confluent airspace consolidation. No visible pleural effusions. Pulmonary venous hypertension without evidence of pulmonary edema. IMPRESSION: 1. Moderate changes of acute bronchitis and/or asthma. 2. Streaky atelectasis versus bronchopneumonia involving the RIGHT lung base. Electronically Signed   By: Evangeline Dakin M.D.   On: 08/16/2019 18:45    Medications:   . amitriptyline  75 mg Oral QHS  . amLODipine  10 mg Oral Daily  . azithromycin  250 mg Oral Daily  . Chlorhexidine Gluconate Cloth  6 each Topical Daily  . dicyclomine  20 mg Oral QID  . Eluxadoline  1 tablet Oral BID  . enoxaparin (LOVENOX) injection  40 mg Subcutaneous Q24H  . guaiFENesin  600 mg Oral BID  . insulin aspart  0-9 Units Subcutaneous TID WC  . insulin glargine  15 Units Subcutaneous QHS  . methylPREDNISolone (SOLU-MEDROL) injection  40 mg Intravenous Q12H  . mometasone-formoterol  2 puff Inhalation BID  . rosuvastatin  40 mg Oral q1800  . tiotropium  18 mcg Inhalation Daily   Continuous Infusions:    LOS: 1 day   Akane Tessier  [facility]     08/18/2019, 4:41 PM

## 2019-08-19 LAB — GLUCOSE, CAPILLARY
Glucose-Capillary: 211 mg/dL — ABNORMAL HIGH (ref 70–99)
Glucose-Capillary: 249 mg/dL — ABNORMAL HIGH (ref 70–99)

## 2019-08-19 MED ORDER — GUAIFENESIN ER 600 MG PO TB12: 600.0000 mg | ORAL_TABLET | Freq: Two times a day (BID) | ORAL | 0 refills | Status: DC

## 2019-08-19 MED ORDER — HYDRALAZINE HCL 25 MG PO TABS: 25.0000 mg | ORAL_TABLET | Freq: Three times a day (TID) | ORAL | Status: DC | PRN

## 2019-08-19 MED ORDER — LISINOPRIL 5 MG PO TABS: 5.0000 mg | ORAL_TABLET | Freq: Every day | ORAL | Status: DC

## 2019-08-19 MED ORDER — DOXAZOSIN MESYLATE 2 MG PO TABS
2.0000 mg | ORAL_TABLET | Freq: Every day | ORAL | Status: DC
  Filled 2019-08-19: qty 1

## 2019-08-19 MED ORDER — AZITHROMYCIN 250 MG PO TABS: ORAL_TABLET | ORAL | 0 refills | Status: DC

## 2019-08-19 MED ORDER — PREDNISONE 10 MG PO TABS: ORAL_TABLET | ORAL | 0 refills | Status: DC

## 2019-08-19 MED ORDER — PREDNISONE 50 MG PO TABS: 50.0000 mg | ORAL_TABLET | Freq: Every day | ORAL | Status: DC

## 2019-08-19 NOTE — Plan of Care (Signed)
Pt going home with home O2. Problem: Education: Goal: Knowledge of General Education information will improve Description: Including pain rating scale, medication(s)/side effects and non-pharmacologic comfort measures Outcome: Adequate for Discharge   Problem: Health Behavior/Discharge Planning: Goal: Ability to manage health-related needs will improve Outcome: Adequate for Discharge   Problem: Clinical Measurements: Goal: Ability to maintain clinical measurements within normal limits will improve Outcome: Adequate for Discharge Goal: Will remain free from infection Outcome: Adequate for Discharge Goal: Diagnostic test results will improve Outcome: Adequate for Discharge Goal: Respiratory complications will improve Outcome: Adequate for Discharge Goal: Cardiovascular complication will be avoided Outcome: Adequate for Discharge   Problem: Activity: Goal: Risk for activity intolerance will decrease Outcome: Adequate for Discharge   Problem: Nutrition: Goal: Adequate nutrition will be maintained Outcome: Adequate for Discharge   Problem: Coping: Goal: Level of anxiety will decrease Outcome: Adequate for Discharge   Problem: Elimination: Goal: Will not experience complications related to bowel motility Outcome: Adequate for Discharge Goal: Will not experience complications related to urinary retention Outcome: Adequate for Discharge   Problem: Pain Managment: Goal: General experience of comfort will improve Outcome: Adequate for Discharge   Problem: Safety: Goal: Ability to remain free from injury will improve Outcome: Adequate for Discharge   Problem: Skin Integrity: Goal: Risk for impaired skin integrity will decrease Outcome: Adequate for Discharge

## 2019-08-19 NOTE — Discharge Summary (Addendum)
[facility] NAME: Tracy Hood    MR#:  [id number]  DATE OF BIRTH:  12/03/1960  DATE OF ADMISSION:  08/16/2019 ADMITTING PHYSICIAN: Jennye Boroughs, MD  DATE OF DISCHARGE: 08/19/2019  PRIMARY CARE PHYSICIAN: Perrin Maltese, MD    ADMISSION DIAGNOSIS:  Epigastric abdominal pain [R10.13] Hypoxia [R09.02] Acute respiratory failure with hypoxia (HCC) [J96.01] Abdominal pain [R10.9] Acute hypoxemic respiratory failure ([place]) [J96.01]  DISCHARGE DIAGNOSIS:  Acute on Chronic hypoxic Respiratory failure due to COPD flare Acute Bronchitis  SECONDARY DIAGNOSIS:   Past Medical History:  Diagnosis Date  . Anxiety   . Arthritis   . COPD (chronic obstructive pulmonary disease) ([place])   . Diabetes mellitus without complication ([place])    type 2  . Dyspnea   . Hypertension   . IBS (irritable bowel syndrome)   . Obesity   . Sleep apnea    STARTED USING CPAP 3 MONTHS AGO    HOSPITAL COURSE:  Tracy Hood is an 59 y.o. female with medical history significant for COPD, chronic hypoxemic respiratory failure with intermittent use of 1 L/min oxygen at night, OSA on CPAP at night, type II DM, hypertension, hyperlipidemia, diarrhea predominant IBS.  She presented to the hospital because of epigastric discomfort, nausea, vomiting and increasing shortness of breath.  Acute on chronic hypoxemic respiratory failure: due to COPD/Asthma flare with Acute Bronchitis -Patient is down to 32 L/min from 5 L/min oxygen per nasal cannula--assess for home oxygen use during daytime - D-dimer was significantly elevated (1,533).  CTA of the chest did not show any pneumonia or pulmonary embolism -Complete zithromax as out pt for Bronchitis -No evidence of pneumonia on CT scan of the chest - Repeat Covid test was negative.   -steroid taper for  COPD exacerbation -cont bronchodilators. -pt not wheezing --feeling a lot better  Hypertension: Continue  antihypertensives -prn hydralazine - on po doxazosin and amlodipine. Pt to keep log of BP at home and f/u PCP  Type 2 diabetes mellitus, sugars uncontrolled due to steroids - Hemoglobin A1c is 6.6.   -resume Semaglutide at discharged -was on  Lantus for hyperglycemia likely induced by steroids.   -Use NovoLog as needed for hyperglycemia.  Hypokalemia: Improved  Chronic IBS cont Viberzi  DVT prophylaxis: Lovenox Code Status: Full code Family Communication: Plan discussed with patient  D/c home today after home oxygen assessment completed. Pt agreeable. TOC informed for possible oxygen need during daytime   CONSULTS OBTAINED:    DRUG ALLERGIES:   Allergies  Allergen Reactions  . Ace Inhibitors     swelling of lip/face and closing of throat  . Sulfa Antibiotics Itching    DISCHARGE MEDICATIONS:   Allergies as of 08/19/2019      Reactions   Ace Inhibitors    swelling of lip/face and closing of throat   Sulfa Antibiotics Itching      Medication List    TAKE these medications   amitriptyline 25 MG tablet Commonly known as: ELAVIL Take 50 mg by mouth at bedtime as needed for sleep.   amLODipine 10 MG tablet Commonly known as: NORVASC Take 10 mg by mouth daily.   azithromycin 250 MG tablet Commonly known as: ZITHROMAX Take daily as directed Start taking on: August 20, 2019   budesonide-formoterol 160-4.5 MCG/ACT inhaler Commonly known as: SYMBICORT Inhale 1 puff into the lungs 2 (two) times daily.   doxazosin 2 MG tablet Commonly known as: CARDURA Take 2 mg by mouth  at bedtime.   furosemide 40 MG tablet Commonly known as: LASIX Take 40 mg by mouth daily as needed for fluid.   guaiFENesin 600 MG 12 hr tablet Commonly known as: MUCINEX Take 1 tablet (600 mg total) by mouth 2 (two) times daily.   MULTIVITAMIN WOMEN 50+ PO Take 1 tablet by mouth daily.   predniSONE 10 MG tablet Commonly known as: DELTASONE Take 50 mg daily--taper by 10 mg daily  then stop Start taking on: August 20, 2019   rosuvastatin 40 MG tablet Commonly known as: CRESTOR Take 40 mg by mouth daily.   RYBELSUS PO Take 1 tablet by mouth daily.   tiotropium 18 MCG inhalation capsule Commonly known as: SPIRIVA Place 18 mcg into inhaler and inhale daily.   Viberzi 100 MG Tabs Generic drug: Eluxadoline Take 1 tablet by mouth 2 (two) times daily.            Durable Medical Equipment  (From admission, onward)         Start     Ordered   08/19/19 0955  DME Oxygen  Once    Question Answer Comment  Length of Need 6 Months   Mode or (Route) Nasal cannula   Liters per Minute 2   Frequency Continuous (stationary and portable oxygen unit needed)   Oxygen conserving device Yes   Oxygen delivery system Gas      08/19/19 0955          If you experience worsening of your admission symptoms, develop shortness of breath, life threatening emergency, suicidal or homicidal thoughts you must seek medical attention immediately by calling 911 or calling your MD immediately  if symptoms less severe.  You Must read complete instructions/literature along with all the possible adverse reactions/side effects for all the Medicines you take and that have been prescribed to you. Take any new Medicines after you have completely understood and accept all the possible adverse reactions/side effects.   Please note  You were cared for by a hospitalist during your hospital stay. If you have any questions about your discharge medications or the care you received while you were in the hospital after you are discharged, you can call the unit and asked to speak with the hospitalist on call if the hospitalist that took care of you is not available. Once you are discharged, your primary care physician will handle any further medical issues. Please note that NO REFILLS for any discharge medications will be authorized once you are discharged, as it is imperative that you return to  your primary care physician (or establish a relationship with a primary care physician if you do not have one) for your aftercare needs so that they can reassess your need for medications and monitor your lab values. Today   SUBJECTIVE   I feel better. Ate BF  VITAL SIGNS:  Blood pressure (!) 189/89, pulse 63, temperature 97.6 F (36.4 C), temperature source Oral, resp. rate 16, height 5\' 7"  (1.702 m), weight 80.3 kg, SpO2 99 %.  I/O:    Intake/Output Summary (Last 24 hours) at 08/19/2019 0955 Last data filed at 08/19/2019 0110 Gross per 24 hour  Intake --  Output 1800 ml  Net -1800 ml    PHYSICAL EXAMINATION:  GENERAL:  59 y.o.-year-old patient lying in the bed with no acute distress. Obese EYES: Pupils equal, round, reactive to light and accommodation. No scleral icterus.  HEENT: Head atraumatic, normocephalic. Oropharynx and nasopharynx clear.  NECK:  Supple, no jugular venous  distention. No thyroid enlargement, no tenderness.  LUNGS: Normal breath sounds bilaterally, no wheezing, rales,rhonchi or crepitation. No use of accessory muscles of respiration.  CARDIOVASCULAR: S1, S2 normal. No murmurs, rubs, or gallops.  ABDOMEN: Soft, non-tender, non-distended. Bowel sounds present. No organomegaly or mass.  EXTREMITIES: No pedal edema, cyanosis, or clubbing.  NEUROLOGIC: Cranial nerves II through XII are intact. Muscle strength 5/5 in all extremities. Sensation intact. Gait not checked.  PSYCHIATRIC: The patient is alert and oriented x 3.  SKIN: No obvious rash, lesion, or ulcer.   DATA REVIEW:   CBC  Recent Labs  Lab 08/17/19 0008  WBC 8.5  HGB 14.4  HCT 42.4  PLT 221    Chemistries  Recent Labs  Lab 08/16/19 1820 08/16/19 1820 08/17/19 0008  NA 138   < > 135  K 2.9*   < > 4.8  CL 104   < > 103  CO2 25   < > 21*  GLUCOSE 136*   < > 152*  BUN 10   < > 11  CREATININE 0.76   < > 0.57  CALCIUM 8.6*   < > 8.3*  MG 1.8  --   --   AST 60*  --   --   ALT 27  --    --   ALKPHOS 77  --   --   BILITOT 0.9  --   --    < > = values in this interval not displayed.    Microbiology Results   Recent Results (from the past 240 hour(s))  Respiratory Panel by RT PCR (Flu A&B, Covid) - Nasopharyngeal Swab     Status: None   Collection Time: 08/16/19  8:19 PM   Specimen: Nasopharyngeal Swab  Result Value Ref Range Status   SARS Coronavirus 2 by RT PCR NEGATIVE NEGATIVE Final    Comment: (NOTE) SARS-CoV-2 target nucleic acids are NOT DETECTED. The SARS-CoV-2 RNA is generally detectable in upper respiratoy specimens during the acute phase of infection. The lowest concentration of SARS-CoV-2 viral copies this assay can detect is 131 copies/mL. A negative result does not preclude SARS-Cov-2 infection and should not be used as the sole basis for treatment or other patient management decisions. A negative result may occur with  improper specimen collection/handling, submission of specimen other than nasopharyngeal swab, presence of viral mutation(s) within the areas targeted by this assay, and inadequate number of viral copies (<131 copies/mL). A negative result must be combined with clinical observations, patient history, and epidemiological information. The expected result is Negative. Fact Sheet for Patients:  PinkCheek.be Fact Sheet for Healthcare Providers:  GravelBags.it This test is not yet ap proved or cleared by the [place] FDA and  has been authorized for detection and/or diagnosis of SARS-CoV-2 by FDA under an Emergency Use Authorization (EUA). This EUA will remain  in effect (meaning this test can be used) for the duration of the COVID-19 declaration under Section 564(b)(1) of the Act, 21 U.S.C. section 360bbb-3(b)(1), unless the authorization is terminated or revoked sooner.    Influenza A by PCR NEGATIVE NEGATIVE Final   Influenza B by PCR NEGATIVE NEGATIVE Final    Comment:  (NOTE) The Xpert Xpress SARS-CoV-2/FLU/RSV assay is intended as an aid in  the diagnosis of influenza from Nasopharyngeal swab specimens and  should not be used as a sole basis for treatment. Nasal washings and  aspirates are unacceptable for Xpert Xpress SARS-CoV-2/FLU/RSV  testing. Fact Sheet for Patients: PinkCheek.be Fact Sheet for Healthcare  Providers: GravelBags.it This test is not yet approved or cleared by the [place] and  has been authorized for detection and/or diagnosis of SARS-CoV-2 by  FDA under an Emergency Use Authorization (EUA). This EUA will remain  in effect (meaning this test can be used) for the duration of the  Covid-19 declaration under Section 564(b)(1) of the Act, 21  U.S.C. section 360bbb-3(b)(1), unless the authorization is  terminated or revoked. Performed at [facility], [place], [place] [address]   SARS CORONAVIRUS 2 (TAT 6-24 HRS) Nasopharyngeal Nasopharyngeal Swab     Status: None   Collection Time: 08/17/19  5:43 PM   Specimen: Nasopharyngeal Swab  Result Value Ref Range Status   SARS Coronavirus 2 NEGATIVE NEGATIVE Final    Comment: (NOTE) SARS-CoV-2 target nucleic acids are NOT DETECTED. The SARS-CoV-2 RNA is generally detectable in upper and lower respiratory specimens during the acute phase of infection. Negative results do not preclude SARS-CoV-2 infection, do not rule out co-infections with other pathogens, and should not be used as the sole basis for treatment or other patient management decisions. Negative results must be combined with clinical observations, patient history, and epidemiological information. The expected result is Negative. Fact Sheet for Patients: SugarRoll.be Fact Sheet for Healthcare Providers: https://www.woods-mathews.com/ This test is not yet approved or cleared by the [place]  FDA and  has been authorized for detection and/or diagnosis of SARS-CoV-2 by FDA under an Emergency Use Authorization (EUA). This EUA will remain  in effect (meaning this test can be used) for the duration of the COVID-19 declaration under Section 56 4(b)(1) of the Act, 21 U.S.C. section 360bbb-3(b)(1), unless the authorization is terminated or revoked sooner. Performed at [facility], [place] [address], [place], [place] [address]     RADIOLOGY:  CT ANGIO CHEST PE W OR WO CONTRAST  Result Date: 08/17/2019 CLINICAL DATA:  59 year old female with hypoxia. Concern for pulmonary embolism. EXAM: CT ANGIOGRAPHY CHEST WITH CONTRAST TECHNIQUE: Multidetector CT imaging of the chest was performed using the standard protocol during bolus administration of intravenous contrast. Multiplanar CT image reconstructions and MIPs were obtained to evaluate the vascular anatomy. CONTRAST:  85mL OMNIPAQUE IOHEXOL 350 MG/ML SOLN COMPARISON:  Chest radiograph dated 08/16/2019. FINDINGS: Cardiovascular: There is no cardiomegaly or pericardial effusion. Coronary vascular calcification and partially calcified mitral annulus. There is mild atherosclerotic calcification of the thoracic aorta. No aneurysmal dilatation or dissection. The origins of the great vessels of the aortic arch appear patent. Evaluation of the pulmonary arteries is limited due to respiratory motion artifact. No large central pulmonary artery embolus identified. Mediastinum/Nodes: There is no hilar or mediastinal adenopathy. The esophagus is grossly unremarkable. The thyroid gland is mildly enlarged and lobulated with probable nodules. Further evaluation with ultrasound on a nonemergent/outpatient basis recommended. No mediastinal fluid collection. Lungs/Pleura: There is mild chronic interstitial coarsening and bronchitic changes. Bibasilar linear atelectasis/scarring noted. No focal consolidation, pleural effusion, pneumothorax. The central airways are  patent. Upper Abdomen: Mild irregularity of the liver contour concerning for early changes of cirrhosis. Clinical correlation is recommended. Musculoskeletal: Degenerative changes of the spine. No acute osseous pathology. Review of the MIP images confirms the above findings. IMPRESSION: 1. No acute intrathoracic pathology. No CT evidence of central pulmonary artery embolus. 2. Chronic bronchitic changes. 3. Coronary vascular calcification and Aortic Atherosclerosis (ICD10-I70.0). Electronically Signed   By: Anner Crete M.D.   On: 08/17/2019 22:14   US Venous Img Lower Bilateral (DVT)  Result Date:  08/19/2019 CLINICAL DATA:  Shortness of breath, history of DVT. Negative CTA chest. EXAM: BILATERAL LOWER EXTREMITY VENOUS DOPPLER ULTRASOUND TECHNIQUE: Gray-scale sonography with compression, as well as color and duplex ultrasound, were performed to evaluate the deep venous system(s) from the level of the common femoral vein through the popliteal and proximal calf veins. COMPARISON:  01/31/2018 FINDINGS: VENOUS Normal compressibility of the common femoral, superficial femoral, and popliteal veins, as well as the visualized calf veins. Visualized portions of profunda femoral vein and great saphenous vein unremarkable. No filling defects to suggest DVT on grayscale or color Doppler imaging. Doppler waveforms show normal direction of venous flow, normal respiratory phasicity and response to augmentation. OTHER None. Limitations: none IMPRESSION: No femoropopliteal DVT nor evidence of DVT within the visualized calf veins. If clinical symptoms are inconsistent or if there are persistent or worsening symptoms, further imaging (possibly involving the iliac veins) may be warranted. Electronically Signed   By: Lucrezia Europe M.D.   On: 08/19/2019 07:17     CODE STATUS:     Code Status Orders  (From admission, onward)         Start     Ordered   08/16/19 2053  Full code  Continuous     08/16/19 2055         Code Status History    Date Active Date Inactive Code Status Order ID Comments User Context   01/28/2018 2321 02/03/2018 1844 Full Code [id number]  Benjamine Sprague, DO Inpatient   01/28/2018 2321 01/28/2018 2321 Full Code [id number]  Benjamine Sprague, DO Inpatient   Advance Care Planning Activity       TOTAL TIME TAKING CARE OF THIS PATIENT: 40 minutes.    Fritzi Mandes M.D  [facility]    CC: Primary care physician; Perrin Maltese, MD

## 2019-08-19 NOTE — Progress Notes (Signed)
Written and verbal discharge instructions discussed with pt. Discussed medication, follow-up appointment, O2 concentrator education and when to call MD or return to ER. Pt verbalized understanding. IV and tele box dc'd. Belongings with pt. 1600 Pt to front entrance via wheelchair with O2 by tech for [employer] ride.

## 2019-08-19 NOTE — Progress Notes (Signed)
Paged E. Ouma, NP concerning BP 183/89 (113) and no PRN medications for BP available.

## 2019-08-19 NOTE — Discharge Instructions (Signed)
   Shortness of Breath, Adult Shortness of breath means you have trouble breathing. Shortness of breath could be a sign of a medical problem. Follow these instructions at home:   Watch for any changes in your symptoms.  Do not use any products that contain nicotine or tobacco, such as cigarettes, e-cigarettes, and chewing tobacco.  Do not smoke. Smoking can cause shortness of breath. If you need help to quit smoking, ask your doctor.  Avoid things that can make it harder to breathe, such as: ? Mold. ? Dust. ? Air pollution. ? Chemical smells. ? Things that can cause allergy symptoms (allergens), if you have allergies.  Keep your living space clean. Use products that help remove mold and dust.  Rest as needed. Slowly return to your normal activities.  Take over-the-counter and prescription medicines only as told by your doctor. This includes oxygen therapy and inhaled medicines.  Keep all follow-up visits as told by your doctor. This is important. Contact a doctor if:  Your condition does not get better as soon as expected.  You have a hard time doing your normal activities, even after you rest.  You have new symptoms. Get help right away if:  Your shortness of breath gets worse.  You have trouble breathing when you are resting.  You feel light-headed or you pass out (faint).  You have a cough that is not helped by medicines.  You cough up blood.  You have pain with breathing.  You have pain in your chest, arms, shoulders, or belly (abdomen).  You have a fever.  You cannot walk up stairs.  You cannot exercise the way you normally do. These symptoms may represent a serious problem that is an emergency. Do not wait to see if the symptoms will go away. Get medical help right away. Call your local emergency services (911 in the [place] Do not drive yourself to the hospital. Summary  Shortness of breath is when you have trouble breathing enough air. It can be a sign  of a medical problem.  Avoid things that make it hard for you to breathe, such as smoking, pollution, mold, and dust.  Watch for any changes in your symptoms. Contact your doctor if you do not get better or you get worse. This information is not intended to replace advice given to you by your health care provider. Make sure you discuss any questions you have with your health care provider. Document Revised: 11/25/2017 Document Reviewed: 11/25/2017 Elsevier Patient Education  [place]. Use you oxygen as instructed

## 2019-08-19 NOTE — Progress Notes (Signed)
SATURATION QUALIFICATIONS: (This note is used to comply with regulatory documentation for home oxygen)  Patient Saturations on Room Air at Rest = 92%  Patient Saturations on Room Air while Ambulating = 77%  Patient Saturations on 2 Liters of oxygen while Ambulating = 92%  Please briefly explain why patient needs home oxygen: respiratory distress with hypoxia; hx COPD

## 2019-12-30 ENCOUNTER — Emergency Department
Admission: EM | Admit: 2019-12-30 | Discharge: 2019-12-30 | Disposition: A | Discharge status: Home / Self Care | Payer: Medicaid Other | Attending: Emergency Medicine | Admitting: Emergency Medicine

## 2019-12-30 ENCOUNTER — Emergency Department: Payer: Medicaid Other

## 2019-12-30 ENCOUNTER — Other Ambulatory Visit: Payer: Self-pay

## 2019-12-30 DIAGNOSIS — J449 Chronic obstructive pulmonary disease, unspecified: Secondary | ICD-10-CM | POA: Insufficient documentation

## 2019-12-30 DIAGNOSIS — E119 Type 2 diabetes mellitus without complications: Secondary | ICD-10-CM | POA: Diagnosis not present

## 2019-12-30 DIAGNOSIS — I10 Essential (primary) hypertension: Secondary | ICD-10-CM | POA: Insufficient documentation

## 2019-12-30 DIAGNOSIS — M542 Cervicalgia: Secondary | ICD-10-CM | POA: Insufficient documentation

## 2019-12-30 DIAGNOSIS — Z79899 Other long term (current) drug therapy: Secondary | ICD-10-CM | POA: Insufficient documentation

## 2019-12-30 DIAGNOSIS — Z87891 Personal history of nicotine dependence: Secondary | ICD-10-CM | POA: Insufficient documentation

## 2019-12-30 DIAGNOSIS — M62838 Other muscle spasm: Secondary | ICD-10-CM

## 2019-12-30 LAB — CBC
HCT: 43.8 % (ref 36.0–46.0)
Hemoglobin: 15 g/dL (ref 12.0–15.0)
MCH: 27.6 pg (ref 26.0–34.0)
MCHC: 34.2 g/dL (ref 30.0–36.0)
MCV: 80.7 fL (ref 80.0–100.0)
Platelets: 249 10*3/uL (ref 150–400)
RBC: 5.43 MIL/uL — ABNORMAL HIGH (ref 3.87–5.11)
RDW: 13.2 % (ref 11.5–15.5)
WBC: 7.8 10*3/uL (ref 4.0–10.5)
nRBC: 0 % (ref 0.0–0.2)

## 2019-12-30 LAB — BASIC METABOLIC PANEL
Anion gap: 11 (ref 5–15)
BUN: 7 mg/dL (ref 6–20)
CO2: 23 mmol/L (ref 22–32)
Calcium: 8.8 mg/dL — ABNORMAL LOW (ref 8.9–10.3)
Chloride: 105 mmol/L (ref 98–111)
Creatinine, Ser: 0.74 mg/dL (ref 0.44–1.00)
GFR calc Af Amer: >60 mL/min (ref >60)
GFR calc non Af Amer: >60 mL/min (ref >60)
Glucose, Bld: 164 mg/dL — ABNORMAL HIGH (ref 70–99)
Potassium: 3.4 mmol/L — ABNORMAL LOW (ref 3.5–5.1)
Sodium: 139 mmol/L (ref 135–145)

## 2019-12-30 LAB — TROPONIN I (HIGH SENSITIVITY): Troponin I (High Sensitivity): 4 ng/L (ref <18)

## 2019-12-30 MED ORDER — MELOXICAM 15 MG PO TABS
15.0000 mg | ORAL_TABLET | Freq: Every day | ORAL | 0 refills | Status: AC
Start: 2019-12-30 — End: 2020-01-29

## 2019-12-30 MED ORDER — DIAZEPAM 2 MG PO TABS
2.0000 mg | ORAL_TABLET | Freq: Once | ORAL | Status: AC
  Administered 2019-12-30: 2 mg via ORAL
  Filled 2019-12-30: qty 1

## 2019-12-30 MED ORDER — ORPHENADRINE CITRATE 30 MG/ML IJ SOLN
60.0000 mg | Freq: Two times a day (BID) | INTRAMUSCULAR | Status: DC
  Administered 2019-12-30: 60 mg via INTRAMUSCULAR
  Filled 2019-12-30: qty 2

## 2019-12-30 MED ORDER — DIAZEPAM 2 MG PO TABS: 2.0000 mg | ORAL_TABLET | Freq: Three times a day (TID) | ORAL | 0 refills | Status: AC | PRN

## 2019-12-30 MED ORDER — KETOROLAC TROMETHAMINE 30 MG/ML IJ SOLN
30.0000 mg | Freq: Once | INTRAMUSCULAR | Status: AC
  Administered 2019-12-30: 30 mg via INTRAMUSCULAR
  Filled 2019-12-30: qty 1

## 2019-12-30 NOTE — ED Provider Notes (Signed)
Emergency Department Provider Note  ____________________________________________  Time seen: Approximately 3:25 PM  I have reviewed the triage vital signs and the nursing notes.   HISTORY  Chief Complaint Neck Pain   Historian Patient     HPI Tracy Hood is a 59 y.o. female with a history of anxiety, arthritis and recently diagnosed thyroid nodule, presents to the emergency department with acute neck pain and stiffness for the past 1 to 2 days.  Patient states that she has difficulty laterally rotating her neck.  She denies fever and chills.  No falls or mechanisms of trauma.  She denies pain underneath her tongue or swelling of the neck.  She can easily manage her own secretions.  She denies a history of similar symptoms in the past.  No other alleviating measures have been attempted.   Past Medical History:  Diagnosis Date  . Anxiety   . Arthritis   . COPD (chronic obstructive pulmonary disease) ([place])   . Diabetes mellitus without complication ([place])    type 2  . Dyspnea   . Hypertension   . IBS (irritable bowel syndrome)   . Obesity   . Sleep apnea    STARTED USING CPAP 3 MONTHS AGO     Immunizations up to date:  Yes.     Past Medical History:  Diagnosis Date  . Anxiety   . Arthritis   . COPD (chronic obstructive pulmonary disease) ([place])   . Diabetes mellitus without complication ([place])    type 2  . Dyspnea   . Hypertension   . IBS (irritable bowel syndrome)   . Obesity   . Sleep apnea    STARTED USING CPAP 3 MONTHS AGO    Patient Active Problem List   Diagnosis Date Noted  . Acute hypoxemic respiratory failure ([place]) 08/17/2019  . Acute respiratory failure with hypoxia ([place]) 08/16/2019  . COPD with acute exacerbation ([place]) 08/16/2019  . Hyperlipidemia associated with type 2 diabetes mellitus ([place]) 08/16/2019  . Diabetes ([place]) 02/03/2018  . Hypertension associated with diabetes ([place]) 02/03/2018  . Appendicitis with peritoneal abscess 01/28/2018     Past Surgical History:  Procedure Laterality Date  . COLONOSCOPY WITH PROPOFOL N/A 12/05/2018   Procedure: COLONOSCOPY WITH PROPOFOL;  Surgeon: Lollie Sails, MD;  Location: [facility] ENDOSCOPY;  Service: Endoscopy;  Laterality: N/A;  . IR RADIOLOGIST EVAL & MGMT  02/05/2018  . JOINT REPLACEMENT Left 2018   tkr  . LAPAROSCOPIC APPENDECTOMY N/A 01/06/2019   Procedure: APPENDECTOMY LAPAROSCOPIC, DIABETIC;  Surgeon: Benjamine Sprague, DO;  Location: [facility] ORS;  Service: General;  Laterality: N/A;  . [place]    Prior to Admission medications   Medication Sig Start Date End Date Taking? Authorizing Provider  amitriptyline (ELAVIL) 25 MG tablet Take 50 mg by mouth at bedtime as needed for sleep.     [provider]  amLODipine (NORVASC) 10 MG tablet Take 10 mg by mouth daily.    [provider]  azithromycin (ZITHROMAX) 250 MG tablet Take daily as directed 08/20/19   Fritzi Mandes, MD  budesonide-formoterol [facility]) 160-4.5 MCG/ACT inhaler Inhale 1 puff into the lungs 2 (two) times daily.     [provider]  diazepam (VALIUM) 2 MG tablet Take 1 tablet (2 mg total) by mouth every 8 (eight) hours as needed for up to 7 days for anxiety. 12/30/19 01/06/20  Menshew, Dannielle Karvonen, PA-C  doxazosin (CARDURA) 2 MG tablet Take 2 mg by mouth at bedtime.  07/29/19  [provider]  furosemide (LASIX) 40 MG tablet Take 40 mg by mouth daily as needed for fluid.    [provider]  meloxicam (MOBIC) 15 MG tablet Take 1 tablet (15 mg total) by mouth daily. 12/30/19 01/29/20  Menshew, Dannielle Karvonen, PA-C  Multiple Vitamins-Minerals (MULTIVITAMIN WOMEN 50+ PO) Take 1 tablet by mouth daily.    [provider]  predniSONE (DELTASONE) 10 MG tablet Take 50 mg daily--taper by 10 mg daily then stop 08/20/19   Fritzi Mandes, MD  rosuvastatin (CRESTOR) 40 MG tablet Take 40 mg by mouth daily.     [provider]  Semaglutide (RYBELSUS PO) Take 1 tablet by  mouth daily.    [provider]  tiotropium (SPIRIVA) 18 MCG inhalation capsule Place 18 mcg into inhaler and inhale daily.    [provider]  VIBERZI 100 MG TABS Take 1 tablet by mouth 2 (two) times daily. 08/06/19   [provider]    Allergies Ace inhibitors and Sulfa antibiotics  Family History  Problem Relation Age of Onset  . Colon cancer Sister     Social History Social History   Tobacco Use  . Smoking status: Former Smoker    Types: Cigarettes    Quit date: 2019    Years since quitting: 2.4  . Smokeless tobacco: Never Used  Vaping Use  . Vaping Use: Never used  Substance Use Topics  . Alcohol use: Yes    Alcohol/week: 4.0 standard drinks    Types: 4 Glasses of wine per week  . Drug use: Never     Review of Systems  Constitutional: No fever/chills Eyes:  No discharge ENT: No upper respiratory complaints. Respiratory: no cough. No SOB/ use of accessory muscles to breath Gastrointestinal:   No nausea, no vomiting.  No diarrhea.  No constipation. Musculoskeletal: Patient has neck pain.  Skin: Negative for rash, abrasions, lacerations, ecchymosis.   ____________________________________________   PHYSICAL EXAM:  VITAL SIGNS: ED Triage Vitals [12/30/19 1227]  Enc Vitals Group     BP (!) 175/87     Pulse Rate 72     Resp 18     Temp 98.2 F (36.8 C)     Temp Source Oral     SpO2 96 %     Weight 272 lb (123.4 kg)     Height 5\' 7"  (1.702 m)     Head Circumference      Peak Flow      Pain Score 8     Pain Loc      Pain Edu?      Excl. in [place]?      Constitutional: Alert and oriented. Well appearing and in no acute distress. Eyes: Conjunctivae are normal. PERRL. EOMI. Head: Atraumatic. ENT:      Nose: No congestion/rhinnorhea.      Mouth/Throat: Mucous membranes are moist.  Neck: No stridor.  No cervical spine tenderness to palpation.  Negative Kernig and Brudzinski. Cardiovascular: Normal rate, regular rhythm. Normal S1  and S2.  Good peripheral circulation. Respiratory: Normal respiratory effort without tachypnea or retractions. Lungs CTAB. Good air entry to the bases with no decreased or absent breath sounds Gastrointestinal: Bowel sounds x 4 quadrants. Soft and nontender to palpation. No guarding or rigidity. No distention. Musculoskeletal: Full range of motion to all extremities. No obvious deformities noted.  Patient has reproducible tenderness to palpation along left upper trapezius and paraspinal muscles along the cervical spine. Neurologic:  Normal for age. No gross  focal neurologic deficits are appreciated.  Skin:  Skin is warm, dry and intact. No rash noted. Psychiatric: Mood and affect are normal for age. Speech and behavior are normal.   ____________________________________________   LABS (all labs ordered are listed, but only abnormal results are displayed)  Labs Reviewed  BASIC METABOLIC PANEL - Abnormal; Notable for the following components:      Result Value   Potassium 3.4 (*)    Glucose, Bld 164 (*)    Calcium 8.8 (*)    All other components within normal limits  CBC - Abnormal; Notable for the following components:   RBC 5.43 (*)    All other components within normal limits  TROPONIN I (HIGH SENSITIVITY)  TROPONIN I (HIGH SENSITIVITY)   ____________________________________________  EKG   ____________________________________________  RADIOLOGY Unk Pinto, personally viewed and evaluated these images (plain radiographs) as part of my medical decision making, as well as reviewing the written report by the radiologist.  DG Chest 2 View  Result Date: 12/30/2019 CLINICAL DATA:  Neck pain. EXAM: CHEST - 2 VIEW COMPARISON:  August 16, 2019. FINDINGS: Stable cardiomediastinal silhouette. No pneumothorax or pleural effusion is noted. Mild central pulmonary vascular congestion is noted. Mild reticular and interstitial densities are noted throughout both lungs which may represent  minimal pulmonary edema. Bony thorax is unremarkable. IMPRESSION: Mild central pulmonary vascular congestion. Mild reticular and interstitial densities are noted throughout both lungs which may represent minimal pulmonary edema. Electronically Signed   By: Marijo Conception M.D.   On: 12/30/2019 13:22    ____________________________________________    PROCEDURES  Procedure(s) performed:     Procedures     Medications  orphenadrine (NORFLEX) injection 60 mg (60 mg Intramuscular Given 12/30/19 1602)  ketorolac (TORADOL) 30 MG/ML injection 30 mg (30 mg Intramuscular Given 12/30/19 1602)  diazepam (VALIUM) tablet 2 mg (2 mg Oral Given 12/30/19 1749)     ____________________________________________   INITIAL IMPRESSION / ASSESSMENT AND PLAN / ED COURSE  Pertinent labs & imaging results that were available during my care of the patient were reviewed by me and considered in my medical decision making (see chart for details).      Assessment and Plan:  Torticollis:  59 year old female presents to the emergency department with muscle spasms of the upper back and neck for the past 1 to 2 days.  Patient was hypertensive at triage but vital signs were otherwise reassuring.  Patient had difficulty performing lateral rotation at the neck.  CBC and BMP were reassuring.  Troponin was within reference range.  Chest x-ray indicated mild pulmonary edema.  Patient reported that her neck pain significantly improved with Toradol,, Norflex and Valium.  She was discharged with meloxicam and Valium. Return precautions were given to return with new or worsening symptoms.   ____________________________________________  FINAL CLINICAL IMPRESSION(S) / ED DIAGNOSES  Final diagnoses:  Neck pain  Muscle spasms of neck      NEW MEDICATIONS STARTED DURING THIS VISIT:  ED Discharge Orders         Ordered    meloxicam (MOBIC) 15 MG tablet  Daily     Discontinue  Reprint     12/30/19 1850     diazepam (VALIUM) 2 MG tablet  Every 8 hours PRN     Discontinue  Reprint     12/30/19 1850              This chart was dictated using voice recognition software/Dragon. Despite best efforts to  proofread, errors can occur which can change the meaning. Any change was purely unintentional.     Karren Cobble 12/30/19 1940    Carrie Mew, MD 12/30/19 2244

## 2019-12-30 NOTE — ED Triage Notes (Signed)
Pt comes POV with left sided neck pain with turning and some external throat pain. Pt states recent US of thyroid with results of a nodule. Pt states pain is muscular.

## 2019-12-30 NOTE — Discharge Instructions (Signed)
Take the meds as directed.  Apply moist heat to promote pain and muscle spasm relief.  Follow-up with your provider return to the ED as needed.

## 2020-07-05 ENCOUNTER — Other Ambulatory Visit: Payer: Self-pay

## 2020-07-05 ENCOUNTER — Emergency Department: Payer: Medicare Other

## 2020-07-05 ENCOUNTER — Emergency Department
Admission: EM | Admit: 2020-07-05 | Discharge: 2020-07-05 | Disposition: A | Discharge status: Home / Self Care | Payer: Medicare Other | Attending: Emergency Medicine | Admitting: Emergency Medicine

## 2020-07-05 DIAGNOSIS — Z7984 Long term (current) use of oral hypoglycemic drugs: Secondary | ICD-10-CM | POA: Diagnosis not present

## 2020-07-05 DIAGNOSIS — I10 Essential (primary) hypertension: Secondary | ICD-10-CM | POA: Diagnosis not present

## 2020-07-05 DIAGNOSIS — Z79899 Other long term (current) drug therapy: Secondary | ICD-10-CM | POA: Insufficient documentation

## 2020-07-05 DIAGNOSIS — Z20822 Contact with and (suspected) exposure to covid-19: Secondary | ICD-10-CM | POA: Diagnosis not present

## 2020-07-05 DIAGNOSIS — Z96652 Presence of left artificial knee joint: Secondary | ICD-10-CM | POA: Diagnosis not present

## 2020-07-05 DIAGNOSIS — J069 Acute upper respiratory infection, unspecified: Secondary | ICD-10-CM | POA: Insufficient documentation

## 2020-07-05 DIAGNOSIS — E1169 Type 2 diabetes mellitus with other specified complication: Secondary | ICD-10-CM | POA: Insufficient documentation

## 2020-07-05 DIAGNOSIS — E785 Hyperlipidemia, unspecified: Secondary | ICD-10-CM | POA: Diagnosis not present

## 2020-07-05 DIAGNOSIS — R059 Cough, unspecified: Secondary | ICD-10-CM | POA: Diagnosis present

## 2020-07-05 DIAGNOSIS — E1159 Type 2 diabetes mellitus with other circulatory complications: Secondary | ICD-10-CM | POA: Diagnosis not present

## 2020-07-05 DIAGNOSIS — Z7951 Long term (current) use of inhaled steroids: Secondary | ICD-10-CM | POA: Insufficient documentation

## 2020-07-05 DIAGNOSIS — J449 Chronic obstructive pulmonary disease, unspecified: Secondary | ICD-10-CM | POA: Insufficient documentation

## 2020-07-05 DIAGNOSIS — Z87891 Personal history of nicotine dependence: Secondary | ICD-10-CM | POA: Insufficient documentation

## 2020-07-05 DIAGNOSIS — J209 Acute bronchitis, unspecified: Secondary | ICD-10-CM | POA: Insufficient documentation

## 2020-07-05 LAB — BASIC METABOLIC PANEL
Anion gap: 10 (ref 5–15)
BUN: 6 mg/dL (ref 6–20)
CO2: 21 mmol/L — ABNORMAL LOW (ref 22–32)
Calcium: 8.4 mg/dL — ABNORMAL LOW (ref 8.9–10.3)
Chloride: 106 mmol/L (ref 98–111)
Creatinine, Ser: 0.39 mg/dL — ABNORMAL LOW (ref 0.44–1.00)
GFR, Estimated: >60 mL/min (ref >60)
Glucose, Bld: 115 mg/dL — ABNORMAL HIGH (ref 70–99)
Potassium: 3.3 mmol/L — ABNORMAL LOW (ref 3.5–5.1)
Sodium: 137 mmol/L (ref 135–145)

## 2020-07-05 LAB — CBC
HCT: 43.8 % (ref 36.0–46.0)
Hemoglobin: 14.9 g/dL (ref 12.0–15.0)
MCH: 27.6 pg (ref 26.0–34.0)
MCHC: 34 g/dL (ref 30.0–36.0)
MCV: 81.1 fL (ref 80.0–100.0)
Platelets: 177 10*3/uL (ref 150–400)
RBC: 5.4 MIL/uL — ABNORMAL HIGH (ref 3.87–5.11)
RDW: 15 % (ref 11.5–15.5)
WBC: 4.6 10*3/uL (ref 4.0–10.5)
nRBC: 0 % (ref 0.0–0.2)

## 2020-07-05 MED ORDER — PREDNISONE 20 MG PO TABS
60.0000 mg | ORAL_TABLET | Freq: Once | ORAL | Status: AC
  Administered 2020-07-05: 60 mg via ORAL
  Filled 2020-07-05: qty 3

## 2020-07-05 MED ORDER — AZITHROMYCIN 500 MG PO TABS
500.0000 mg | ORAL_TABLET | Freq: Once | ORAL | Status: AC
  Administered 2020-07-05: 500 mg via ORAL
  Filled 2020-07-05: qty 1

## 2020-07-05 MED ORDER — PREDNISONE 10 MG PO TABS
10.0000 mg | ORAL_TABLET | Freq: Every day | ORAL | 0 refills | Status: DC
Start: 2020-07-05 — End: 2022-07-27

## 2020-07-05 MED ORDER — GUAIFENESIN-CODEINE 100-10 MG/5ML PO SOLN: 5.0000 mL | Freq: Four times a day (QID) | ORAL | 0 refills | Status: DC | PRN

## 2020-07-05 MED ORDER — AZITHROMYCIN 250 MG PO TABS
250.0000 mg | ORAL_TABLET | Freq: Every day | ORAL | 0 refills | Status: DC
Start: 2020-07-05 — End: 2020-11-03

## 2020-07-05 NOTE — ED Provider Notes (Signed)
[facility] Emergency Department Provider Note  Time seen: 2:15 PM  I have reviewed the triage vital signs and the nursing notes.   HISTORY  Chief Complaint Cough   HPI Tracy Hood is a 59 y.o. female with a past medical history of anxiety, arthritis, COPD, diabetes, hypertension  presents emergency department for several days of cough and congestion.  According to the patient over the past 3 to 4 days she has had a cough and congestion.  States she occasionally feels like she can get sputum up but is unable to do so.  States she had a low-grade fever yesterday.  Patient is fully vaccinated per patient.  Denies any chest pain.  Patient states she is prescribed O2 at night.  Past Medical History:  Diagnosis Date  . Anxiety   . Arthritis   . COPD (chronic obstructive pulmonary disease) ([place])   . Diabetes mellitus without complication ([place])    type 2  . Dyspnea   . Hypertension   . IBS (irritable bowel syndrome)   . Obesity   . Sleep apnea    STARTED USING CPAP 3 MONTHS AGO    Patient Active Problem List   Diagnosis Date Noted  . Acute hypoxemic respiratory failure ([place]) 08/17/2019  . Acute respiratory failure with hypoxia ([place]) 08/16/2019  . COPD with acute exacerbation ([place]) 08/16/2019  . Hyperlipidemia associated with type 2 diabetes mellitus ([place]) 08/16/2019  . Diabetes ([place]) 02/03/2018  . Hypertension associated with diabetes ([place]) 02/03/2018  . Appendicitis with peritoneal abscess 01/28/2018    Past Surgical History:  Procedure Laterality Date  . COLONOSCOPY WITH PROPOFOL N/A 12/05/2018   Procedure: COLONOSCOPY WITH PROPOFOL;  Surgeon: Lollie Sails, MD;  Location: [facility] ENDOSCOPY;  Service: Endoscopy;  Laterality: N/A;  . IR RADIOLOGIST EVAL & MGMT  02/05/2018  . JOINT REPLACEMENT Left 2018   tkr  . LAPAROSCOPIC APPENDECTOMY N/A 01/06/2019   Procedure: APPENDECTOMY LAPAROSCOPIC, DIABETIC;  Surgeon: Benjamine Sprague, DO;  Location: [facility] ORS;   Service: General;  Laterality: N/A;  . [place]    Prior to Admission medications   Medication Sig Start Date End Date Taking? Authorizing Provider  amitriptyline (ELAVIL) 25 MG tablet Take 50 mg by mouth at bedtime as needed for sleep.     [provider]  amLODipine (NORVASC) 10 MG tablet Take 10 mg by mouth daily.    [provider]  azithromycin (ZITHROMAX) 250 MG tablet Take daily as directed 08/20/19   Fritzi Mandes, MD  budesonide-formoterol [facility]) 160-4.5 MCG/ACT inhaler Inhale 1 puff into the lungs 2 (two) times daily.     [provider]  doxazosin (CARDURA) 2 MG tablet Take 2 mg by mouth at bedtime.  07/29/19   [provider]  furosemide (LASIX) 40 MG tablet Take 40 mg by mouth daily as needed for fluid.    [provider]  Multiple Vitamins-Minerals (MULTIVITAMIN WOMEN 50+ PO) Take 1 tablet by mouth daily.    [provider]  predniSONE (DELTASONE) 10 MG tablet Take 50 mg daily--taper by 10 mg daily then stop 08/20/19   Fritzi Mandes, MD  rosuvastatin (CRESTOR) 40 MG tablet Take 40 mg by mouth daily.     [provider]  Semaglutide (RYBELSUS PO) Take 1 tablet by mouth daily.    [provider]  tiotropium (SPIRIVA) 18 MCG inhalation capsule Place 18 mcg into inhaler and inhale daily.    [provider]  VIBERZI 100 MG TABS Take  1 tablet by mouth 2 (two) times daily. 08/06/19   [provider]    Allergies  Allergen Reactions  . Ace Inhibitors     swelling of lip/face and closing of throat  . Sulfa Antibiotics Itching    Family History  Problem Relation Age of Onset  . Colon cancer Sister     Social History Social History   Tobacco Use  . Smoking status: Former Smoker    Types: Cigarettes    Quit date: 2019    Years since quitting: 2.9  . Smokeless tobacco: Never Used  Vaping Use  . Vaping Use: Never used  Substance Use Topics  . Alcohol use: Yes     Alcohol/week: 4.0 standard drinks    Types: 4 Glasses of wine per week  . Drug use: Never    Review of Systems Constitutional: Low-grade subjective fever yesterday Cardiovascular: Negative for chest pain. Respiratory: Positive shortness breath.  Positive for cough. Gastrointestinal: Negative for abdominal pain Musculoskeletal: Negative for musculoskeletal complaints Neurological: Negative for headache All other ROS negative  ____________________________________________   PHYSICAL EXAM:  VITAL SIGNS: ED Triage Vitals  Enc Vitals Group     BP 07/05/20 1158 (!) 185/79     Pulse Rate 07/05/20 1158 78     Resp 07/05/20 1158 20     Temp 07/05/20 1158 99 F (37.2 C)     Temp Source 07/05/20 1158 Oral     SpO2 07/05/20 1158 92 %     Weight 07/05/20 1159 269 lb (122 kg)     Height 07/05/20 1159 5\' 7"  (1.702 m)     Head Circumference --      Peak Flow --      Pain Score 07/05/20 1159 0     Pain Loc --      Pain Edu? --      Excl. in [place]? --    Constitutional: Alert and oriented. Well appearing and in no distress. Eyes: Normal exam ENT      Head: Normocephalic and atraumatic.      Mouth/Throat: Mucous membranes are moist. Cardiovascular: Normal rate, regular rhythm. Respiratory: Slight tachypnea.  Mild expiratory wheezes bilaterally. Gastrointestinal: Soft and nontender. No distention.  Musculoskeletal: Nontender with normal range of motion in all extremities.  Neurologic:  Normal speech and language. No gross focal neurologic deficits Skin:  Skin is warm, dry and intact.  Psychiatric: Mood and affect are normal.  ____________________________________________   RADIOLOGY  Chest x-ray negative for acute abnormality  ____________________________________________   INITIAL IMPRESSION / ASSESSMENT AND PLAN / ED COURSE  Pertinent labs & imaging results that were available during my care of the patient were reviewed by me and considered in my medical decision making (see  chart for details).   Patient presents emergency department for cough congestion shortness of breath ongoing for the past few days.  Patient does have expiratory wheeze on exam.  Patient states she normally gets steroids and feels much better.  We will check for Covid as a precaution, dose prednisone and Zithromax.  I offered to treat with duo nebs in the emergency department.  Patient states she has breathing treatments at home and would prefer to go home.  I discussed return precautions.  Patient agreeable to plan of care.  Tracy Hood was evaluated in Emergency Department on 07/05/2020 for the symptoms described in the history of present illness. She was evaluated in the context of the global COVID-19 pandemic, which necessitated consideration that the patient might  be at risk for infection with the SARS-CoV-2 virus that causes COVID-19. Institutional protocols and algorithms that pertain to the evaluation of patients at risk for COVID-19 are in a state of rapid change based on information released by regulatory bodies including the CDC and federal and state organizations. These policies and algorithms were followed during the patient's care in the ED.  ____________________________________________   FINAL CLINICAL IMPRESSION(S) / ED DIAGNOSES  Upper respiratory infection Acute bronchitis   Minna Antis, MD 07/05/20 1427

## 2020-07-05 NOTE — ED Triage Notes (Signed)
Pt reports a hx of copd and bronchitis, states that she has been coughing and wheezing for the past few days and her inhalers aren't helping. Pt states that she uses O2 at night and states that usually when she gets this way she needs prednisone, an antibiotic and breathing treatments

## 2020-07-06 LAB — SARS CORONAVIRUS 2 (TAT 6-24 HRS): SARS Coronavirus 2: NEGATIVE

## 2020-11-03 ENCOUNTER — Other Ambulatory Visit: Payer: Self-pay

## 2020-11-03 ENCOUNTER — Ambulatory Visit (INDEPENDENT_AMBULATORY_CARE_PROVIDER_SITE_OTHER): Payer: Medicare Other | Admitting: Obstetrics and Gynecology

## 2020-11-03 ENCOUNTER — Encounter: Payer: Self-pay | Admitting: Obstetrics and Gynecology

## 2020-11-03 VITALS — BP 181/84 | HR 89 | Ht 67.0 in | Wt 266.4 lb

## 2020-11-03 DIAGNOSIS — N950 Postmenopausal bleeding: Secondary | ICD-10-CM | POA: Diagnosis not present

## 2020-11-03 DIAGNOSIS — D219 Benign neoplasm of connective and other soft tissue, unspecified: Secondary | ICD-10-CM

## 2020-11-03 DIAGNOSIS — R1033 Periumbilical pain: Secondary | ICD-10-CM

## 2020-11-03 NOTE — Progress Notes (Signed)
HPI:      [name] is a 60 y.o. No obstetric history on file. who LMP was No LMP recorded. Patient is postmenopausal.  Subjective:   She presents today stating that she has had fibroids for more than 20 years but that they are causing her problems now.  She went into menopause approximately 4 years ago and has not had regular menses since then.  When she was in her 32s her bleeding was very heavy with significant cramping.  She does state that she has had bleeding several times a year since menopause.  She last bled approximately 1 month ago. She complains of pressure on her bladder and midline abdominal pain around her umbilicus.  She states that she has always been very opposed to surgery.     Hx: The following portions of the patient's history were reviewed and updated as appropriate:             She  has a past medical history of Anxiety, Arthritis, COPD (chronic obstructive pulmonary disease) ([place]), Diabetes mellitus without complication ([place]), Dyspnea, Hypertension, IBS (irritable bowel syndrome), Obesity, and Sleep apnea. She does not have any pertinent problems on file. She  has a past surgical history that includes IR Radiologist Eval & Mgmt (02/05/2018); Tubal ligation (1990); Colonoscopy with propofol (N/A, 12/05/2018); Joint replacement (Left, 2018); and laparoscopic appendectomy (N/A, 01/06/2019). Her family history includes Colon cancer in her sister. She  reports that she quit smoking about 3 years ago. Her smoking use included cigarettes. She has never used smokeless tobacco. She reports current alcohol use of about 4.0 standard drinks of alcohol per week. She reports that she does not use drugs. She has a current medication list which includes the following prescription(s): amitriptyline, amlodipine, doxazosin, multiple vitamins-minerals, rosuvastatin, semaglutide, budesonide-formoterol, furosemide, guaifenesin-codeine, prednisone, tiotropium, and viberzi. She is allergic to  ace inhibitors and sulfa antibiotics.       Review of Systems:  Review of Systems  Constitutional: Denied constitutional symptoms, night sweats, recent illness, fatigue, fever, insomnia and weight loss.  Eyes: Denied eye symptoms, eye pain, photophobia, vision change and visual disturbance.  Ears/Nose/Throat/Neck: Denied ear, nose, throat or neck symptoms, hearing loss, nasal discharge, sinus congestion and sore throat.  Cardiovascular: Denied cardiovascular symptoms, arrhythmia, chest pain/pressure, edema, exercise intolerance, orthopnea and palpitations.  Respiratory: Denied pulmonary symptoms, asthma, pleuritic pain, productive sputum, cough, dyspnea and wheezing.  Gastrointestinal: Denied, gastro-esophageal reflux, melena, nausea and vomiting.  Genitourinary: Denied genitourinary symptoms including symptomatic vaginal discharge, pelvic relaxation issues, and urinary complaints.  Musculoskeletal: Denied musculoskeletal symptoms, stiffness, swelling, muscle weakness and myalgia.  Dermatologic: Denied dermatology symptoms, rash and scar.  Neurologic: Denied neurology symptoms, dizziness, headache, neck pain and syncope.  Psychiatric: Denied psychiatric symptoms, anxiety and depression.  Endocrine: Denied endocrine symptoms including hot flashes and night sweats.   Meds:   Current Outpatient Medications on File Prior to Visit  Medication Sig Dispense Refill  . amitriptyline (ELAVIL) 25 MG tablet Take 50 mg by mouth at bedtime as needed for sleep.     Marland Kitchen amLODipine (NORVASC) 10 MG tablet Take 10 mg by mouth daily.    Marland Kitchen doxazosin (CARDURA) 2 MG tablet Take 2 mg by mouth at bedtime.     . Multiple Vitamins-Minerals (MULTIVITAMIN WOMEN 50+ PO) Take 1 tablet by mouth daily.    . rosuvastatin (CRESTOR) 40 MG tablet Take 40 mg by mouth daily.     . Semaglutide (RYBELSUS PO) Take 1 tablet by mouth daily.    Marland Kitchen  budesonide-formoterol (SYMBICORT) 160-4.5 MCG/ACT inhaler Inhale 1 puff into the lungs 2  (two) times daily.  (Patient not taking: Reported on 11/03/2020)    . furosemide (LASIX) 40 MG tablet Take 40 mg by mouth daily as needed for fluid. (Patient not taking: Reported on 11/03/2020)    . guaiFENesin-codeine 100-10 MG/5ML syrup Take 5 mLs by mouth every 6 (six) hours as needed for cough. (Patient not taking: Reported on 11/03/2020) 120 mL 0  . predniSONE (DELTASONE) 10 MG tablet Take 1 tablet (10 mg total) by mouth daily. Day 1-3: take 4 tablets PO daily Day 4-6: take 3 tablets PO daily Day 7-9: take 2 tablets PO daily Day 10-12: take 1 tablet PO daily (Patient not taking: Reported on 11/03/2020) 30 tablet 0  . tiotropium (SPIRIVA) 18 MCG inhalation capsule Place 18 mcg into inhaler and inhale daily. (Patient not taking: Reported on 11/03/2020)    . VIBERZI 100 MG TABS Take 1 tablet by mouth 2 (two) times daily. (Patient not taking: Reported on 11/03/2020)     No current facility-administered medications on file prior to visit.          Objective:     Vitals:   11/03/20 0756  BP: (!) 181/84  Pulse: 89   Filed Weights   11/03/20 0756  Weight: 266 lb 6.4 oz (120.8 kg)    Abdominal examination reveals a mass above the umbilicus. (Possibly uterus)  Small umbilical hernia noted-tender to palpation     Assessment:    No obstetric history on file. Patient Active Problem List   Diagnosis Date Noted  . Acute hypoxemic respiratory failure ([place]) 08/17/2019  . Acute respiratory failure with hypoxia ([place]) 08/16/2019  . COPD with acute exacerbation ([place]) 08/16/2019  . Hyperlipidemia associated with type 2 diabetes mellitus ([place]) 08/16/2019  . Diabetes ([place]) 02/03/2018  . Hypertension associated with diabetes ([place]) 02/03/2018  . Appendicitis with peritoneal abscess 01/28/2018     1. Postmenopausal bleeding   2. Fibroids   3. Periumbilical abdominal pain     Patient has known large uterine fibroids.  Probably now smaller in menopause but uterus still likely to be  enlarged.  Patient has postmenopausal bleeding for several years without work-up.  It is possible this is secondary to fibroids however endometrial investigation including ultrasound and possible sampling is necessary.   Plan:            1.  Ultrasound to delineate endometrial thickness and size and location of uterine fibroids  2.  Discussed the possibility of umbilical hernia repair with general surgery  3.  We will continue our discussion regarding management of large uterine fibroids and menopause after ultrasound returns. Orders No orders of the defined types were placed in this encounter.   No orders of the defined types were placed in this encounter.     F/U  No follow-ups on file. I spent 33 minutes involved in the care of this patient preparing to see the patient by obtaining and reviewing her medical history (including labs, imaging tests and prior procedures), documenting clinical information in the electronic health record (EHR), counseling and coordinating care plans, writing and sending prescriptions, ordering tests or procedures and directly communicating with the patient by discussing pertinent items from her history and physical exam as well as detailing my assessment and plan as noted above so that she has an informed understanding.  All of her questions were answered.  Finis Bud, M.D. 11/03/2020 8:26 AM

## 2020-11-23 ENCOUNTER — Ambulatory Visit: Payer: Medicaid Other

## 2020-12-27 ENCOUNTER — Ambulatory Visit
Admission: RE | Admit: 2020-12-27 | Discharge: 2020-12-27 | Disposition: A | Discharge status: Home / Self Care | Payer: Medicare Other | Source: Ambulatory Visit | Attending: Obstetrics and Gynecology | Admitting: Obstetrics and Gynecology

## 2020-12-27 ENCOUNTER — Other Ambulatory Visit: Payer: Self-pay

## 2020-12-27 DIAGNOSIS — D219 Benign neoplasm of connective and other soft tissue, unspecified: Secondary | ICD-10-CM | POA: Diagnosis present

## 2020-12-27 DIAGNOSIS — N950 Postmenopausal bleeding: Secondary | ICD-10-CM

## 2021-01-02 ENCOUNTER — Telehealth: Payer: Self-pay | Admitting: Obstetrics and Gynecology

## 2021-01-02 NOTE — Telephone Encounter (Signed)
Please advise on results

## 2021-01-02 NOTE — Telephone Encounter (Signed)
Patient is requesting ultrasound results

## 2021-01-03 NOTE — Telephone Encounter (Signed)
Notified patient of [place] results. I have scheduled her to come in for Endometrium biopsy.

## 2021-01-10 ENCOUNTER — Other Ambulatory Visit (HOSPITAL_COMMUNITY)
Admission: RE | Admit: 2021-01-10 | Discharge: 2021-01-10 | Disposition: A | Discharge status: Home / Self Care | Payer: Medicare Other | Source: Ambulatory Visit | Attending: Obstetrics and Gynecology | Admitting: Obstetrics and Gynecology

## 2021-01-10 ENCOUNTER — Other Ambulatory Visit: Payer: Self-pay

## 2021-01-10 ENCOUNTER — Encounter: Payer: Self-pay | Admitting: Obstetrics and Gynecology

## 2021-01-10 ENCOUNTER — Ambulatory Visit (INDEPENDENT_AMBULATORY_CARE_PROVIDER_SITE_OTHER): Payer: Medicare Other | Admitting: Obstetrics and Gynecology

## 2021-01-10 VITALS — BP 178/88 | HR 86 | Ht 67.0 in | Wt 266.9 lb

## 2021-01-10 DIAGNOSIS — D219 Benign neoplasm of connective and other soft tissue, unspecified: Secondary | ICD-10-CM | POA: Insufficient documentation

## 2021-01-10 DIAGNOSIS — N950 Postmenopausal bleeding: Secondary | ICD-10-CM | POA: Diagnosis not present

## 2021-01-10 NOTE — Progress Notes (Signed)
HPI:      [name] is a 60 y.o. No obstetric history on file. who LMP was No LMP recorded. Patient is postmenopausal.  Subjective:   She presents today for an endometrial biopsy.  She has had postmenopausal bleeding for quite a while.  She has known uterine fibroids.  Her ultrasound revealed largest fibroid approximately 13 cm in size with an endometrial lining of 6 mm.  Endometrial lining was difficult to visualize because of overlying fibroids.    Hx: The following portions of the patient's history were reviewed and updated as appropriate:             She  has a past medical history of Anxiety, Arthritis, COPD (chronic obstructive pulmonary disease) ([place]), Diabetes mellitus without complication ([place]), Dyspnea, Hypertension, IBS (irritable bowel syndrome), Obesity, and Sleep apnea. She does not have any pertinent problems on file. She  has a past surgical history that includes IR Radiologist Eval & Mgmt (02/05/2018); Tubal ligation (1990); Colonoscopy with propofol (N/A, 12/05/2018); Joint replacement (Left, 2018); and laparoscopic appendectomy (N/A, 01/06/2019). Her family history includes Colon cancer in her sister. She  reports that she quit smoking about 3 years ago. Her smoking use included cigarettes. She has never used smokeless tobacco. She reports current alcohol use of about 4.0 standard drinks of alcohol per week. She reports that she does not use drugs. She has a current medication list which includes the following prescription(s): amitriptyline, amlodipine, budesonide-formoterol, doxazosin, furosemide, guaifenesin-codeine, multiple vitamins-minerals, prednisone, rosuvastatin, semaglutide, tiotropium, and viberzi. She is allergic to ace inhibitors and sulfa antibiotics.       Review of Systems:  Review of Systems  Constitutional: Denied constitutional symptoms, night sweats, recent illness, fatigue, fever, insomnia and weight loss.  Eyes: Denied eye symptoms, eye pain,  photophobia, vision change and visual disturbance.  Ears/Nose/Throat/Neck: Denied ear, nose, throat or neck symptoms, hearing loss, nasal discharge, sinus congestion and sore throat.  Cardiovascular: Denied cardiovascular symptoms, arrhythmia, chest pain/pressure, edema, exercise intolerance, orthopnea and palpitations.  Respiratory: Denied pulmonary symptoms, asthma, pleuritic pain, productive sputum, cough, dyspnea and wheezing.  Gastrointestinal: Denied, gastro-esophageal reflux, melena, nausea and vomiting.  Genitourinary: See HPI for additional information.  Musculoskeletal: Denied musculoskeletal symptoms, stiffness, swelling, muscle weakness and myalgia.  Dermatologic: Denied dermatology symptoms, rash and scar.  Neurologic: Denied neurology symptoms, dizziness, headache, neck pain and syncope.  Psychiatric: Denied psychiatric symptoms, anxiety and depression.  Endocrine: Denied endocrine symptoms including hot flashes and night sweats.   Meds:   Current Outpatient Medications on File Prior to Visit  Medication Sig Dispense Refill   amitriptyline (ELAVIL) 25 MG tablet Take 50 mg by mouth at bedtime as needed for sleep.      amLODipine (NORVASC) 10 MG tablet Take 10 mg by mouth daily.     budesonide-formoterol (SYMBICORT) 160-4.5 MCG/ACT inhaler Inhale 1 puff into the lungs 2 (two) times daily.     doxazosin (CARDURA) 2 MG tablet Take 2 mg by mouth at bedtime.      furosemide (LASIX) 40 MG tablet Take 40 mg by mouth daily as needed for fluid.     guaiFENesin-codeine 100-10 MG/5ML syrup Take 5 mLs by mouth every 6 (six) hours as needed for cough. 120 mL 0   Multiple Vitamins-Minerals (MULTIVITAMIN WOMEN 50+ PO) Take 1 tablet by mouth daily.     predniSONE (DELTASONE) 10 MG tablet Take 1 tablet (10 mg total) by mouth daily. Day 1-3: take 4 tablets PO daily Day 4-6: take 3 tablets PO  daily Day 7-9: take 2 tablets PO daily Day 10-12: take 1 tablet PO daily 30 tablet 0   rosuvastatin  (CRESTOR) 40 MG tablet Take 40 mg by mouth daily.      Semaglutide (RYBELSUS PO) Take 1 tablet by mouth daily.     tiotropium (SPIRIVA) 18 MCG inhalation capsule Place 18 mcg into inhaler and inhale daily.     VIBERZI 100 MG TABS Take 1 tablet by mouth 2 (two) times daily.     No current facility-administered medications on file prior to visit.          Objective:     Vitals:   01/10/21 1044  BP: (!) 178/88  Pulse: 86   Filed Weights   01/10/21 1044  Weight: 266 lb 14.4 oz (121.1 kg)              Physical examination   Pelvic:   Vulva: Normal appearance.  No lesions.  Vagina: No lesions or abnormalities noted.  Support: Normal pelvic support.  Urethra No masses tenderness or scarring.  Meatus Normal size without lesions or prolapse.  Cervix: Normal appearance.  No lesions.  Anus: Normal exam.  No lesions.  Perineum: Normal exam.  No lesions.        Bimanual   Uterus: Enlarged and irregular with uterine fibroids.  Non-tender.  Mobile.  AV.  Adnexae: No masses.  Non-tender to palpation.  Cul-de-sac: Negative for abnormality.   Endometrial Biopsy After discussion with the patient regarding her abnormal uterine bleeding I recommended that she proceed with an endometrial biopsy for further diagnosis. The risks, benefits, alternatives, and indications for an endometrial biopsy were discussed with the patient in detail. She understood the risks including infection, bleeding, cervical laceration and uterine perforation.  Verbal consent was obtained.   PROCEDURE NOTE:  Vacurette endometrial biopsy was performed using aseptic technique with iodine preparation.  The uterus was sounded to a length of 11 cm.  Adequate sampling was obtained with minimal blood loss.  The patient tolerated the procedure well.  Disposition will be pending pathology   Assessment:    No obstetric history on file. Patient Active Problem List   Diagnosis Date Noted   Acute hypoxemic respiratory failure  ([place]) 08/17/2019   Acute respiratory failure with hypoxia ([place]) 08/16/2019   COPD with acute exacerbation ([place]) 08/16/2019   Hyperlipidemia associated with type 2 diabetes mellitus ([place]) 08/16/2019   Diabetes ([place]) 02/03/2018   Hypertension associated with diabetes ([place]) 02/03/2018   Appendicitis with peritoneal abscess 01/28/2018     1. Postmenopausal bleeding   2. Fibroids        Plan:            1.  Endometrial biopsy performed.  We will contact patient with results.  We will consider endometrial control measures if endometrial biopsy negative. Orders No orders of the defined types were placed in this encounter.   No orders of the defined types were placed in this encounter.     F/U  Return for We will contact her with any abnormal test results. I spent 12 minutes involved in the care of this patient preparing to see the patient by obtaining and reviewing her medical history (including labs, imaging tests and prior procedures), documenting clinical information in the electronic health record (EHR), counseling and coordinating care plans, writing and sending prescriptions, ordering tests or procedures and directly communicating with the patient by discussing pertinent items from her history and physical exam as well as detailing my  assessment and plan as noted above so that she has an informed understanding.  All of her questions were answered.  Finis Bud, M.D. 01/10/2021 11:11 AM

## 2021-01-11 LAB — SURGICAL PATHOLOGY

## 2021-01-25 ENCOUNTER — Telehealth: Payer: Self-pay | Admitting: Obstetrics and Gynecology

## 2021-01-25 MED ORDER — MEDROXYPROGESTERONE ACETATE 2.5 MG PO TABS: 2.5000 mg | ORAL_TABLET | Freq: Every day | ORAL | 0 refills | Status: DC

## 2021-01-25 NOTE — Telephone Encounter (Signed)
Notified patient of results and sent in Provera to the pharmacy. Scheduled patient for appointment.

## 2021-01-25 NOTE — Telephone Encounter (Signed)
Pt called requesting pathology results

## 2021-02-05 ENCOUNTER — Emergency Department
Admission: EM | Admit: 2021-02-05 | Discharge: 2021-02-05 | Disposition: A | Discharge status: Home / Self Care | Payer: Medicare Other | Attending: Emergency Medicine | Admitting: Emergency Medicine

## 2021-02-05 ENCOUNTER — Other Ambulatory Visit: Payer: Self-pay

## 2021-02-05 ENCOUNTER — Emergency Department: Payer: Medicare Other

## 2021-02-05 DIAGNOSIS — W010XXA Fall on same level from slipping, tripping and stumbling without subsequent striking against object, initial encounter: Secondary | ICD-10-CM | POA: Insufficient documentation

## 2021-02-05 DIAGNOSIS — Z7951 Long term (current) use of inhaled steroids: Secondary | ICD-10-CM | POA: Insufficient documentation

## 2021-02-05 DIAGNOSIS — Z96652 Presence of left artificial knee joint: Secondary | ICD-10-CM | POA: Insufficient documentation

## 2021-02-05 DIAGNOSIS — J441 Chronic obstructive pulmonary disease with (acute) exacerbation: Secondary | ICD-10-CM | POA: Diagnosis not present

## 2021-02-05 DIAGNOSIS — M79605 Pain in left leg: Secondary | ICD-10-CM

## 2021-02-05 DIAGNOSIS — Y9301 Activity, walking, marching and hiking: Secondary | ICD-10-CM | POA: Diagnosis not present

## 2021-02-05 DIAGNOSIS — Z87891 Personal history of nicotine dependence: Secondary | ICD-10-CM | POA: Insufficient documentation

## 2021-02-05 DIAGNOSIS — E119 Type 2 diabetes mellitus without complications: Secondary | ICD-10-CM | POA: Diagnosis not present

## 2021-02-05 DIAGNOSIS — Y9289 Other specified places as the place of occurrence of the external cause: Secondary | ICD-10-CM | POA: Insufficient documentation

## 2021-02-05 DIAGNOSIS — M79604 Pain in right leg: Secondary | ICD-10-CM | POA: Diagnosis not present

## 2021-02-05 DIAGNOSIS — Z7984 Long term (current) use of oral hypoglycemic drugs: Secondary | ICD-10-CM | POA: Diagnosis not present

## 2021-02-05 DIAGNOSIS — Z79899 Other long term (current) drug therapy: Secondary | ICD-10-CM | POA: Insufficient documentation

## 2021-02-05 DIAGNOSIS — S93402A Sprain of unspecified ligament of left ankle, initial encounter: Secondary | ICD-10-CM | POA: Diagnosis not present

## 2021-02-05 DIAGNOSIS — I10 Essential (primary) hypertension: Secondary | ICD-10-CM | POA: Diagnosis not present

## 2021-02-05 DIAGNOSIS — S99912A Unspecified injury of left ankle, initial encounter: Secondary | ICD-10-CM | POA: Diagnosis present

## 2021-02-05 MED ORDER — METHOCARBAMOL 750 MG PO TABS: 750.0000 mg | ORAL_TABLET | Freq: Four times a day (QID) | ORAL | 0 refills | Status: AC | PRN

## 2021-02-05 MED ORDER — MELOXICAM 15 MG PO TABS: 15.0000 mg | ORAL_TABLET | Freq: Every day | ORAL | 0 refills | Status: AC

## 2021-02-05 MED ORDER — KETOROLAC TROMETHAMINE 60 MG/2ML IM SOLN
30.0000 mg | Freq: Once | INTRAMUSCULAR | Status: AC
  Administered 2021-02-05: 30 mg via INTRAMUSCULAR
  Filled 2021-02-05: qty 2

## 2021-02-05 MED ORDER — METHOCARBAMOL 500 MG PO TABS
750.0000 mg | ORAL_TABLET | Freq: Once | ORAL | Status: AC
  Administered 2021-02-05: 750 mg via ORAL
  Filled 2021-02-05: qty 2

## 2021-02-05 MED ORDER — ACETAMINOPHEN 325 MG PO TABS
650.0000 mg | ORAL_TABLET | Freq: Once | ORAL | Status: AC
  Administered 2021-02-05: 650 mg via ORAL
  Filled 2021-02-05: qty 2

## 2021-02-05 MED ORDER — OXYCODONE-ACETAMINOPHEN 5-325 MG PO TABS
1.0000 | ORAL_TABLET | Freq: Once | ORAL | Status: AC
  Administered 2021-02-05: 1 via ORAL
  Filled 2021-02-05: qty 1

## 2021-02-05 NOTE — ED Notes (Signed)
First Nurse Note: pt to ED via EMS for bilateral leg pain s/p fall. Pt is in NAD.

## 2021-02-05 NOTE — ED Provider Notes (Signed)
[facility] Emergency Department Provider Note  ____________________________________________   Event Date/Time   First MD Initiated Contact with Patient 02/05/21 1558     (approximate)  I have reviewed the triage vital signs and the nursing notes.   HISTORY  Chief Complaint Fall   HPI Tracy Hood is a 60 y.o. female who reports to the emergency department for evaluation after a fall that started last night.  Patient states that she tripped while outside on a piece of concrete and fell landing on her bottom and onto her left side.  She reports that she has had a significant amount of pain and difficulty with ambulation since that time.  Pain is worse on the left, but she does have pain on the bilateral lower extremities from the knees down.  She denies any back pain, numbness or tingling, saddle anesthesia.  She denies striking her head during the fall, loss of consciousness, neck pain or headache.         Past Medical History:  Diagnosis Date   Anxiety    Arthritis    COPD (chronic obstructive pulmonary disease) ([place])    Diabetes mellitus without complication (HCC)    type 2   Dyspnea    Hypertension    IBS (irritable bowel syndrome)    Obesity    Sleep apnea    STARTED USING CPAP 3 MONTHS AGO    Patient Active Problem List   Diagnosis Date Noted   Acute hypoxemic respiratory failure ([place]) 08/17/2019   Acute respiratory failure with hypoxia ([place]) 08/16/2019   COPD with acute exacerbation ([place]) 08/16/2019   Hyperlipidemia associated with type 2 diabetes mellitus ([place]) 08/16/2019   Diabetes ([place]) 02/03/2018   Hypertension associated with diabetes ([place]) 02/03/2018   Appendicitis with peritoneal abscess 01/28/2018    Past Surgical History:  Procedure Laterality Date   COLONOSCOPY WITH PROPOFOL N/A 12/05/2018   Procedure: COLONOSCOPY WITH PROPOFOL;  Surgeon: Lollie Sails, MD;  Location: [facility] ENDOSCOPY;  Service: Endoscopy;  Laterality:  N/A;   IR RADIOLOGIST EVAL & MGMT  02/05/2018   JOINT REPLACEMENT Left 2018   tkr   LAPAROSCOPIC APPENDECTOMY N/A 01/06/2019   Procedure: APPENDECTOMY LAPAROSCOPIC, DIABETIC;  Surgeon: Benjamine Sprague, DO;  Location: [facility] ORS;  Service: General;  Laterality: N/A;   TUBAL LIGATION  1990    Prior to Admission medications   Medication Sig Start Date End Date Taking? Authorizing Provider  meloxicam (MOBIC) 15 MG tablet Take 1 tablet (15 mg total) by mouth daily for 15 days. 02/05/21 02/20/21 Yes Tenlee Wollin, Farrel Gordon, PA  methocarbamol (ROBAXIN-750) 750 MG tablet Take 1 tablet (750 mg total) by mouth 4 (four) times daily as needed for up to 10 days for muscle spasms. 02/05/21 02/15/21 Yes Cesilia Shinn, Farrel Gordon, PA  amitriptyline (ELAVIL) 25 MG tablet Take 50 mg by mouth at bedtime as needed for sleep.     [provider]  amLODipine (NORVASC) 10 MG tablet Take 10 mg by mouth daily.    [provider]  budesonide-formoterol (SYMBICORT) 160-4.5 MCG/ACT inhaler Inhale 1 puff into the lungs 2 (two) times daily.    [provider]  doxazosin (CARDURA) 2 MG tablet Take 2 mg by mouth at bedtime.  07/29/19   [provider]  furosemide (LASIX) 40 MG tablet Take 40 mg by mouth daily as needed for fluid.    [provider]  guaiFENesin-codeine 100-10 MG/5ML syrup Take 5 mLs by mouth every 6 (six) hours as needed for  cough. 07/05/20   Harvest Dark, MD  medroxyPROGESTERone (PROVERA) 2.5 MG tablet Take 1 tablet (2.5 mg total) by mouth daily. 01/25/21   Harlin Heys, MD  Multiple Vitamins-Minerals (MULTIVITAMIN WOMEN 50+ PO) Take 1 tablet by mouth daily.    [provider]  predniSONE (DELTASONE) 10 MG tablet Take 1 tablet (10 mg total) by mouth daily. Day 1-3: take 4 tablets PO daily Day 4-6: take 3 tablets PO daily Day 7-9: take 2 tablets PO daily Day 10-12: take 1 tablet PO daily 07/05/20   Harvest Dark, MD  rosuvastatin (CRESTOR) 40 MG tablet Take 40  mg by mouth daily.     [provider]  Semaglutide (RYBELSUS PO) Take 1 tablet by mouth daily.    [provider]  tiotropium (SPIRIVA) 18 MCG inhalation capsule Place 18 mcg into inhaler and inhale daily.    [provider]  VIBERZI 100 MG TABS Take 1 tablet by mouth 2 (two) times daily. 08/06/19   [provider]    Allergies Ace inhibitors and Sulfa antibiotics  Family History  Problem Relation Age of Onset   Colon cancer Sister     Social History Social History   Tobacco Use   Smoking status: Former    Types: Cigarettes    Quit date: 2019    Years since quitting: 3.5   Smokeless tobacco: Never  Vaping Use   Vaping Use: Never used  Substance Use Topics   Alcohol use: Yes    Alcohol/week: 4.0 standard drinks    Types: 4 Glasses of wine per week   Drug use: Never    Review of Systems Constitutional: No fever/chills Eyes: No visual changes. ENT: No sore throat. Cardiovascular: Denies chest pain. Respiratory: Denies shortness of breath. Gastrointestinal: No abdominal pain.  No nausea, no vomiting.  No diarrhea.  No constipation. Genitourinary: Negative for dysuria. Musculoskeletal: + Bilateral lower leg pain Skin: Negative for rash. Neurological: Negative for headaches, focal weakness or numbness. ____________________________________________   PHYSICAL EXAM:  VITAL SIGNS: ED Triage Vitals  Enc Vitals Group     BP 02/05/21 1522 (!) 187/86     Pulse Rate 02/05/21 1522 84     Resp 02/05/21 1522 18     Temp 02/05/21 1522 98.4 F (36.9 C)     Temp src --      SpO2 02/05/21 1522 95 %     Weight 02/05/21 1523 267 lb (121.1 kg)     Height 02/05/21 1523 '5\' 7"'$  (1.702 m)     Head Circumference --      Peak Flow --      Pain Score 02/05/21 1523 10     Pain Loc --      Pain Edu? --      Excl. in [place]? --    Constitutional: Alert and oriented. Well appearing and in no acute distress. Eyes: Conjunctivae are normal. PERRL.  EOMI. Head: Atraumatic. Nose: No congestion/rhinnorhea. Mouth/Throat: Mucous membranes are moist.  Oropharynx non-erythematous. Neck: No stridor.   Cardiovascular: Normal rate, regular rhythm. Grossly normal heart sounds.  Good peripheral circulation. Respiratory: Normal respiratory effort.  No retractions. Lungs CTAB. Gastrointestinal: Soft and nontender. No distention. No abdominal bruits. No CVA tenderness. Musculoskeletal: No tenderness to palpation of the midline or paraspinals of the lumbar spine.  No tenderness to palpation of the bilateral hips.  On the left side, there is diffuse tenderness about the left knee with healed incision from prior arthroplasty.  No significant effusion.  There is mild tenderness in the left calf.  Most significant tenderness is at the lateral aspect of the left ankle with soft tissue swelling noted.  No open wounds present.  Dorsal pedal pulses are 2+ bilaterally.  No erythema. Neurologic:  Normal speech and language. No gross focal neurologic deficits are appreciated. No gait instability. Skin:  Skin is warm, dry and intact. No rash noted. Psychiatric: Mood and affect are normal. Speech and behavior are normal.   ____________________________________________  RADIOLOGY I, Marlana Salvage, personally viewed and evaluated these images (plain radiographs) as part of my medical decision making, as well as reviewing the written report by the radiologist.  ED provider interpretation: X-rays were obtained of the right tib-fib, left ankle and left knee.  On the left side there is no evidence of periprosthetic fracture or complication.  There is soft tissue swelling at the lateral malleolus on the left side with probable small avulsion fracture.  No fractures noted on the right tib-fib x-rays.  Official radiology report(s): DG Tibia/Fibula Right  Result Date: 02/05/2021 CLINICAL DATA:  Fall when walking.  Bilateral leg pain. EXAM: RIGHT TIBIA AND FIBULA - 2 VIEW  COMPARISON:  None. FINDINGS: There is no evidence of fracture or other focal bone lesions. Soft tissues are unremarkable. IMPRESSION: Negative. Electronically Signed   By: Audie Pinto M.D.   On: 02/05/2021 17:41   DG Ankle Complete Left  Result Date: 02/05/2021 CLINICAL DATA:  Jerelyn Scott walking, bilateral leg pain. EXAM: LEFT ANKLE COMPLETE - 3+ VIEW COMPARISON:  None. FINDINGS: There is a tiny bone fragment at the tip of the lateral malleolus suspicious for a small fracture fragment. Distal tibia and talar dome are intact. There is lateral soft tissue swelling. IMPRESSION: Findings suspicious for small fracture at the tip of the lateral malleolus with associated lateral soft tissue swelling. Electronically Signed   By: Audie Pinto M.D.   On: 02/05/2021 17:44   DG Knee Complete 4 Views Left  Result Date: 02/05/2021 CLINICAL DATA:  Fall when walking, bilateral leg pain. EXAM: LEFT KNEE - COMPLETE 4+ VIEW COMPARISON:  None. FINDINGS: Status post total knee replacement. Hardware appears intact and in good alignment. No evidence of fracture, dislocation, or joint effusion. No evidence of arthropathy or other focal bone abnormality. Soft tissues are unremarkable. IMPRESSION: No acute osseous abnormality. Electronically Signed   By: Audie Pinto M.D.   On: 02/05/2021 17:42     ____________________________________________   INITIAL IMPRESSION / ASSESSMENT AND PLAN / ED COURSE  As part of my medical decision making, I reviewed the following data within the [place] notes reviewed and incorporated, Radiograph reviewed, and Notes from prior ED visits        Patient is a 60 year old female who presents to the emergency department for evaluation of mechanical fall that occurred yesterday with pain down the bilateral lower extremities from the knees down worse on left than right.  See HPI for further details.  In triage patient is mildly hypertensive but otherwise has  normal vital signs.  Physical exam as above.  X-rays were obtained and are negative for acute fracture of the right lower extremity.  There is no evidence of left knee periprosthetic fracture or hardware complication.  There is soft tissues swelling and probable small avulsion fracture off the lateral malleolus on the left side.  No other acute abnormalities noted  Patient with reassuring physical exam, neurovascularly intact.  We will provide a cam walking boot for left  ankle sprain with probable small acute avulsion fracture.  Patient is amenable with this plan.  Will control pain with muscle relaxant, anti-inflammatory and Tylenol.  Patient is stable at time for outpatient follow-up, return precautions were discussed.      ____________________________________________   FINAL CLINICAL IMPRESSION(S) / ED DIAGNOSES  Final diagnoses:  Left leg pain  Sprain of left ankle, unspecified ligament, initial encounter  Right leg pain     ED Discharge Orders          Ordered    meloxicam (MOBIC) 15 MG tablet  Daily        02/05/21 1835    methocarbamol (ROBAXIN-750) 750 MG tablet  4 times daily PRN        02/05/21 1835             Note:  This document was prepared using Dragon voice recognition software and may include unintentional dictation errors.    Marlana Salvage, PA 02/05/21 1917    Vanessa [place], MD 02/06/21 [phone]

## 2021-02-05 NOTE — ED Triage Notes (Signed)
Pt to ER via [facility] from home.   Pt reports walking on uneven ground yesterday while wearing slides, missed a step and fell. Denies LOC or hitting head. No blood thinner usage. Complains of bilateral leg pain from knees to ankles, reports pain and swelling is worse in left leg but does have a hx of a knee replacement on this side.

## 2021-02-05 NOTE — Discharge Instructions (Addendum)
Please wear the cam boot as needed for ambulation on the left ankle.  You may use the prescribed Mobic, anti-inflammatory to take once daily.  Please do not take other anti-inflammatories such as ibuprofen or Aleve with this medicine.  You have also been prescribed Robaxin, a muscle relaxer to take up to 4 times daily as needed for pain.  These can be combined safely with Tylenol, up to 1000 mg 4 times daily as needed for pain.  Please follow-up with orthopedics regarding your left ankle sprain and small avulsion fracture.

## 2021-03-03 ENCOUNTER — Other Ambulatory Visit: Payer: Self-pay | Admitting: Family

## 2021-03-03 DIAGNOSIS — Z1231 Encounter for screening mammogram for malignant neoplasm of breast: Secondary | ICD-10-CM

## 2021-04-27 ENCOUNTER — Encounter: Payer: Medicare Other | Admitting: Obstetrics and Gynecology

## 2021-04-27 ENCOUNTER — Encounter: Payer: Self-pay | Admitting: Obstetrics and Gynecology

## 2021-05-16 ENCOUNTER — Encounter: Payer: Medicare Other | Admitting: Obstetrics and Gynecology

## 2022-04-12 DIAGNOSIS — D519 Vitamin B12 deficiency anemia, unspecified: Secondary | ICD-10-CM | POA: Diagnosis not present

## 2022-04-12 DIAGNOSIS — E559 Vitamin D deficiency, unspecified: Secondary | ICD-10-CM | POA: Diagnosis not present

## 2022-04-12 DIAGNOSIS — Z23 Encounter for immunization: Secondary | ICD-10-CM | POA: Diagnosis not present

## 2022-04-12 DIAGNOSIS — Z1231 Encounter for screening mammogram for malignant neoplasm of breast: Secondary | ICD-10-CM | POA: Diagnosis not present

## 2022-04-12 DIAGNOSIS — E782 Mixed hyperlipidemia: Secondary | ICD-10-CM | POA: Diagnosis not present

## 2022-04-12 DIAGNOSIS — Z0000 Encounter for general adult medical examination without abnormal findings: Secondary | ICD-10-CM | POA: Diagnosis not present

## 2022-04-12 DIAGNOSIS — I10 Essential (primary) hypertension: Secondary | ICD-10-CM | POA: Diagnosis not present

## 2022-04-12 DIAGNOSIS — E119 Type 2 diabetes mellitus without complications: Secondary | ICD-10-CM | POA: Diagnosis not present

## 2022-04-12 DIAGNOSIS — E039 Hypothyroidism, unspecified: Secondary | ICD-10-CM | POA: Diagnosis not present

## 2022-04-12 LAB — HM AWV

## 2022-04-13 ENCOUNTER — Other Ambulatory Visit: Payer: Self-pay | Admitting: Nurse Practitioner

## 2022-04-13 ENCOUNTER — Other Ambulatory Visit: Payer: Self-pay | Admitting: Family

## 2022-04-13 DIAGNOSIS — Z1231 Encounter for screening mammogram for malignant neoplasm of breast: Secondary | ICD-10-CM

## 2022-04-13 IMAGING — DX DG KNEE COMPLETE 4+V*L*
4 series · 4 of 4 positions shown · non-contrast
Comparison: None.

CLINICAL DATA: Fall when walking, bilateral leg pain.

EXAM:
LEFT KNEE - COMPLETE 4+ VIEW

[knee ap]
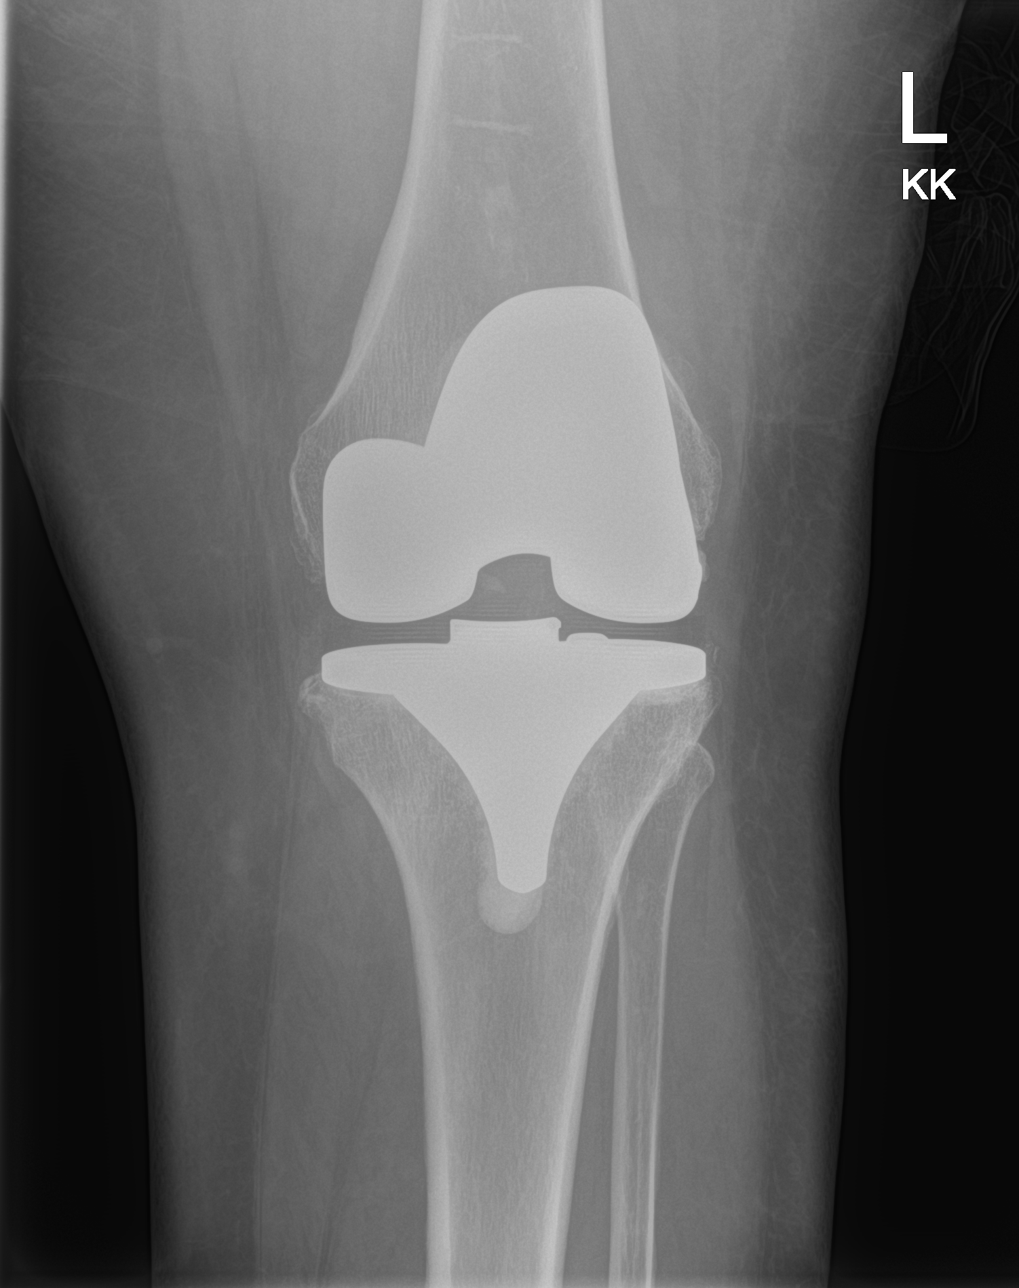

[knee lat]
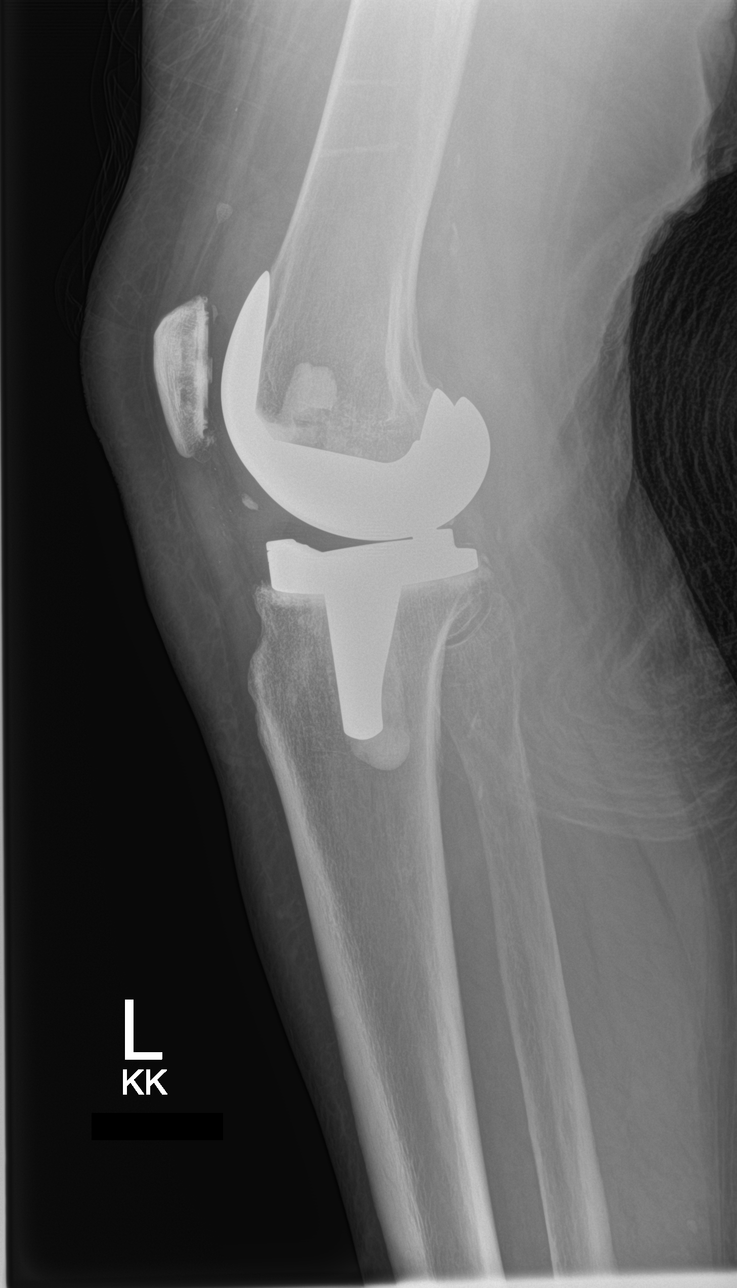

[knee obl (1 of 2)]
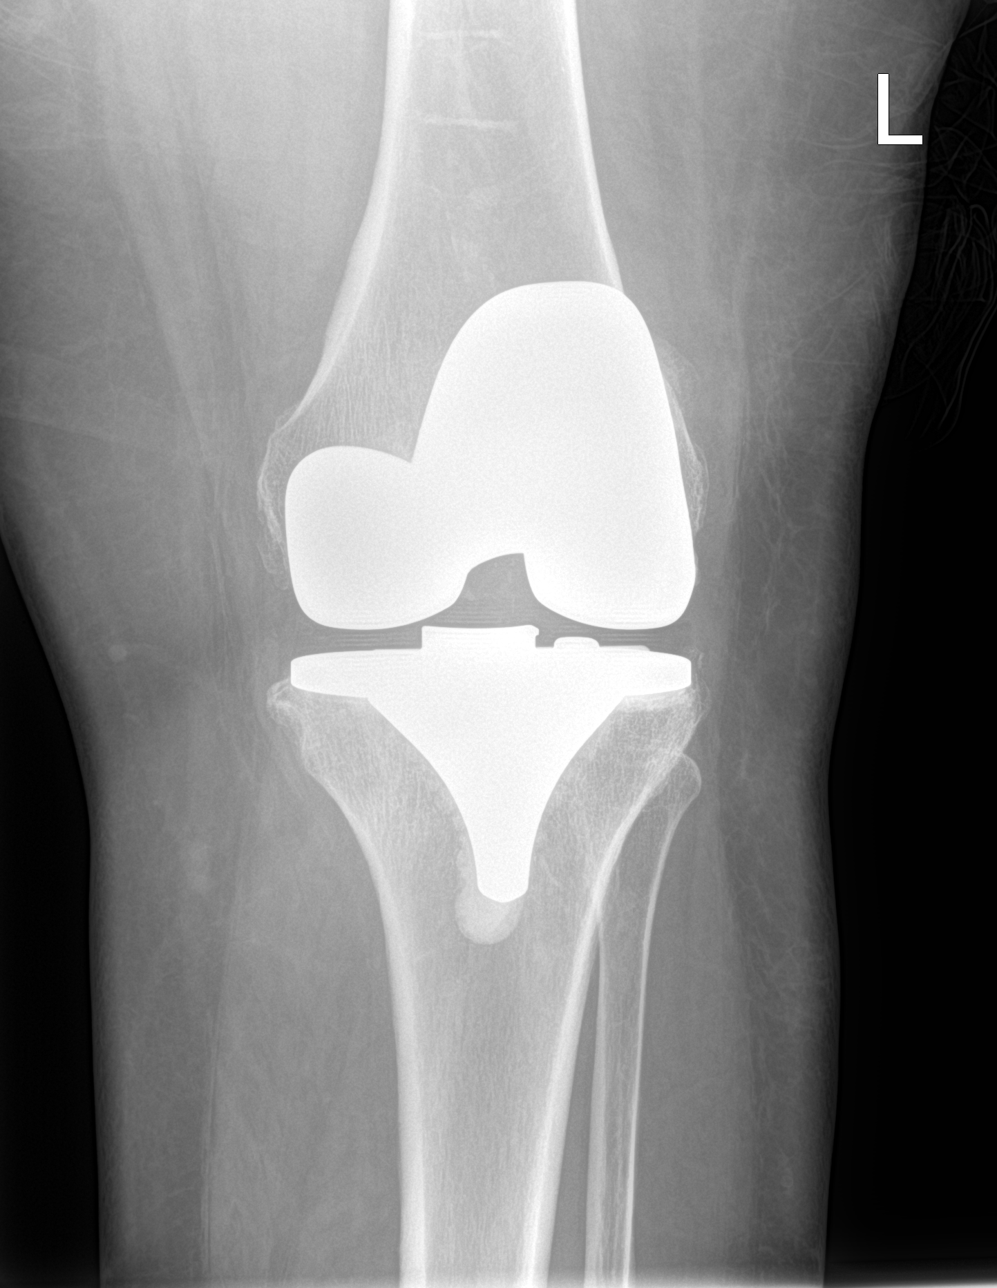

[knee obl (2 of 2)]
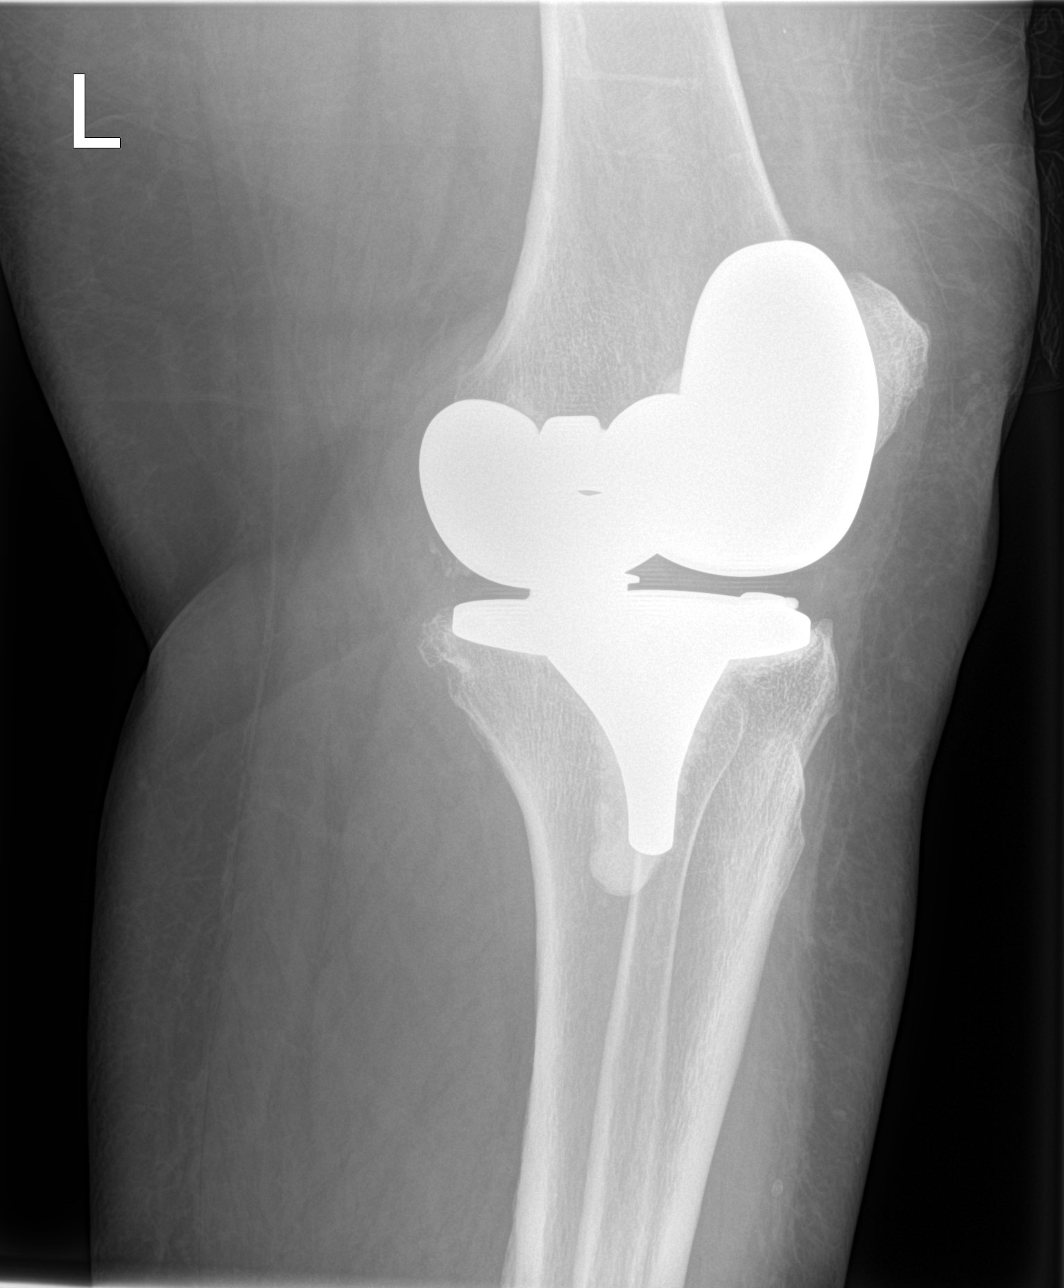

[4 of 4 positions shown; findings below may reference images not displayed]

FINDINGS: Status post total knee replacement. Hardware appears intact and in
good alignment. No evidence of fracture, dislocation, or joint
effusion. No evidence of arthropathy or other focal bone
abnormality. Soft tissues are unremarkable.
IMPRESSION: No acute osseous abnormality.

## 2022-04-26 ENCOUNTER — Ambulatory Visit: Payer: Medicare Other | Admitting: Podiatry

## 2022-05-01 ENCOUNTER — Ambulatory Visit (INDEPENDENT_AMBULATORY_CARE_PROVIDER_SITE_OTHER): Payer: Medicare Other | Admitting: Podiatry

## 2022-05-01 DIAGNOSIS — M79674 Pain in right toe(s): Secondary | ICD-10-CM

## 2022-05-01 DIAGNOSIS — M79675 Pain in left toe(s): Secondary | ICD-10-CM

## 2022-05-01 DIAGNOSIS — B351 Tinea unguium: Secondary | ICD-10-CM | POA: Diagnosis not present

## 2022-05-01 LAB — HM DIABETES FOOT EXAM

## 2022-05-02 ENCOUNTER — Encounter: Payer: Self-pay | Admitting: Podiatry

## 2022-05-02 NOTE — Progress Notes (Signed)
  Subjective:  Patient ID: Tracy Hood, female    DOB: [birth year],  MRN: [id number]  Chief Complaint  Patient presents with   Diabetes   61 y.o. female returns for the above complaint.  Patient presents with thickened elongated dystrophic toenails x10 mild pain on palpation.  Patient states she is not able to debride down herself.  She would like for me to do it.  She is not a diabetic denies any other acute complaints.  Objective:  There were no vitals filed for this visit. Podiatric Exam: Vascular: dorsalis pedis and posterior tibial pulses are palpable bilateral. Capillary return is immediate. Temperature gradient is WNL. Skin turgor WNL  Sensorium: Normal Semmes Weinstein monofilament test. Normal tactile sensation bilaterally. Nail Exam: Pt has thick disfigured discolored nails with subungual debris noted bilateral entire nail hallux through fifth toenails.  Pain on palpation to the nails. Ulcer Exam: There is no evidence of ulcer or pre-ulcerative changes or infection. Orthopedic Exam: Muscle tone and strength are WNL. No limitations in general ROM. No crepitus or effusions noted.  Skin: No Porokeratosis. No infection or ulcers    Assessment & Plan:   1. Pain due to onychomycosis of toenails of both feet     Patient was evaluated and treated and all questions answered.  Onychomycosis with pain  -Nails palliatively debrided as below. -Educated on self-care  Procedure: Nail Debridement Rationale: pain  Type of Debridement: manual, sharp debridement. Instrumentation: Nail nipper, rotary burr. Number of Nails: 10  Procedures and Treatment: Consent by patient was obtained for treatment procedures. The patient understood the discussion of treatment and procedures well. All questions were answered thoroughly reviewed. Debridement of mycotic and hypertrophic toenails, 1 through 5 bilateral and clearing of subungual debris. No ulceration, no infection noted.  Return  Visit-Office Procedure: Patient instructed to return to the office for a follow up visit 3 months for continued evaluation and treatment.  Boneta Lucks, DPM    No follow-ups on file.

## 2022-05-21 ENCOUNTER — Ambulatory Visit
Admission: RE | Admit: 2022-05-21 | Discharge: 2022-05-21 | Disposition: A | Discharge status: Home / Self Care | Payer: Medicare Other | Source: Ambulatory Visit | Attending: Family | Admitting: Family

## 2022-05-21 DIAGNOSIS — Z1231 Encounter for screening mammogram for malignant neoplasm of breast: Secondary | ICD-10-CM | POA: Insufficient documentation

## 2022-05-21 LAB — HM MAMMOGRAPHY

## 2022-06-21 DIAGNOSIS — J449 Chronic obstructive pulmonary disease, unspecified: Secondary | ICD-10-CM | POA: Diagnosis not present

## 2022-06-21 DIAGNOSIS — G4733 Obstructive sleep apnea (adult) (pediatric): Secondary | ICD-10-CM | POA: Diagnosis not present

## 2022-07-13 DIAGNOSIS — R946 Abnormal results of thyroid function studies: Secondary | ICD-10-CM | POA: Diagnosis not present

## 2022-07-13 DIAGNOSIS — E559 Vitamin D deficiency, unspecified: Secondary | ICD-10-CM | POA: Diagnosis not present

## 2022-07-13 DIAGNOSIS — R6889 Other general symptoms and signs: Secondary | ICD-10-CM | POA: Diagnosis not present

## 2022-07-13 DIAGNOSIS — R5383 Other fatigue: Secondary | ICD-10-CM | POA: Diagnosis not present

## 2022-07-13 DIAGNOSIS — Z23 Encounter for immunization: Secondary | ICD-10-CM | POA: Diagnosis not present

## 2022-07-13 DIAGNOSIS — D519 Vitamin B12 deficiency anemia, unspecified: Secondary | ICD-10-CM | POA: Diagnosis not present

## 2022-07-13 DIAGNOSIS — E782 Mixed hyperlipidemia: Secondary | ICD-10-CM | POA: Diagnosis not present

## 2022-07-13 DIAGNOSIS — Z0000 Encounter for general adult medical examination without abnormal findings: Secondary | ICD-10-CM | POA: Diagnosis not present

## 2022-07-13 DIAGNOSIS — E1165 Type 2 diabetes mellitus with hyperglycemia: Secondary | ICD-10-CM | POA: Diagnosis not present

## 2022-07-13 DIAGNOSIS — E119 Type 2 diabetes mellitus without complications: Secondary | ICD-10-CM | POA: Diagnosis not present

## 2022-07-13 DIAGNOSIS — I10 Essential (primary) hypertension: Secondary | ICD-10-CM | POA: Diagnosis not present

## 2022-07-13 DIAGNOSIS — G4733 Obstructive sleep apnea (adult) (pediatric): Secondary | ICD-10-CM | POA: Diagnosis not present

## 2022-07-13 DIAGNOSIS — J449 Chronic obstructive pulmonary disease, unspecified: Secondary | ICD-10-CM | POA: Diagnosis not present

## 2022-07-13 LAB — HEMOGLOBIN A1C: Hemoglobin A1C: 6.2

## 2022-07-27 ENCOUNTER — Emergency Department
Admission: EM | Admit: 2022-07-27 | Discharge: 2022-07-27 | Disposition: A | Discharge status: Home / Self Care | Payer: Medicare HMO | Attending: Emergency Medicine | Admitting: Emergency Medicine

## 2022-07-27 ENCOUNTER — Emergency Department: Payer: Medicare HMO

## 2022-07-27 ENCOUNTER — Other Ambulatory Visit: Payer: Self-pay

## 2022-07-27 DIAGNOSIS — K5730 Diverticulosis of large intestine without perforation or abscess without bleeding: Secondary | ICD-10-CM | POA: Diagnosis not present

## 2022-07-27 DIAGNOSIS — I10 Essential (primary) hypertension: Secondary | ICD-10-CM | POA: Insufficient documentation

## 2022-07-27 DIAGNOSIS — R0789 Other chest pain: Secondary | ICD-10-CM | POA: Diagnosis not present

## 2022-07-27 DIAGNOSIS — R079 Chest pain, unspecified: Secondary | ICD-10-CM | POA: Diagnosis not present

## 2022-07-27 DIAGNOSIS — M791 Myalgia, unspecified site: Secondary | ICD-10-CM | POA: Insufficient documentation

## 2022-07-27 DIAGNOSIS — R0602 Shortness of breath: Secondary | ICD-10-CM | POA: Diagnosis not present

## 2022-07-27 DIAGNOSIS — J449 Chronic obstructive pulmonary disease, unspecified: Secondary | ICD-10-CM | POA: Diagnosis not present

## 2022-07-27 DIAGNOSIS — I700 Atherosclerosis of aorta: Secondary | ICD-10-CM | POA: Diagnosis not present

## 2022-07-27 DIAGNOSIS — M542 Cervicalgia: Secondary | ICD-10-CM | POA: Diagnosis not present

## 2022-07-27 DIAGNOSIS — Z1152 Encounter for screening for COVID-19: Secondary | ICD-10-CM | POA: Insufficient documentation

## 2022-07-27 DIAGNOSIS — I6782 Cerebral ischemia: Secondary | ICD-10-CM | POA: Insufficient documentation

## 2022-07-27 DIAGNOSIS — K7460 Unspecified cirrhosis of liver: Secondary | ICD-10-CM | POA: Diagnosis not present

## 2022-07-27 DIAGNOSIS — M79603 Pain in arm, unspecified: Secondary | ICD-10-CM | POA: Diagnosis not present

## 2022-07-27 DIAGNOSIS — R030 Elevated blood-pressure reading, without diagnosis of hypertension: Secondary | ICD-10-CM | POA: Diagnosis present

## 2022-07-27 DIAGNOSIS — I2510 Atherosclerotic heart disease of native coronary artery without angina pectoris: Secondary | ICD-10-CM | POA: Diagnosis not present

## 2022-07-27 DIAGNOSIS — R0902 Hypoxemia: Secondary | ICD-10-CM | POA: Diagnosis not present

## 2022-07-27 DIAGNOSIS — M549 Dorsalgia, unspecified: Secondary | ICD-10-CM | POA: Diagnosis not present

## 2022-07-27 LAB — RESP PANEL BY RT-PCR (RSV, FLU A&B, COVID)  RVPGX2
Influenza A by PCR: NEGATIVE
Influenza B by PCR: NEGATIVE
Resp Syncytial Virus by PCR: NEGATIVE
SARS Coronavirus 2 by RT PCR: NEGATIVE

## 2022-07-27 LAB — CBC WITH DIFFERENTIAL/PLATELET
Abs Immature Granulocytes: 0.03 10*3/uL (ref 0.00–0.07)
Basophils Absolute: 0.1 10*3/uL (ref 0.0–0.1)
Basophils Relative: 2 %
Eosinophils Absolute: 0.2 10*3/uL (ref 0.0–0.5)
Eosinophils Relative: 3 %
HCT: 44.5 % (ref 36.0–46.0)
Hemoglobin: 15.1 g/dL — ABNORMAL HIGH (ref 12.0–15.0)
Immature Granulocytes: 0 %
Lymphocytes Relative: 20 %
Lymphs Abs: 1.5 10*3/uL (ref 0.7–4.0)
MCH: 27.6 pg (ref 26.0–34.0)
MCHC: 33.9 g/dL (ref 30.0–36.0)
MCV: 81.2 fL (ref 80.0–100.0)
Monocytes Absolute: 0.3 10*3/uL (ref 0.1–1.0)
Monocytes Relative: 5 %
Neutro Abs: 5.1 10*3/uL (ref 1.7–7.7)
Neutrophils Relative %: 70 %
Platelets: 215 10*3/uL (ref 150–400)
RBC: 5.48 MIL/uL — ABNORMAL HIGH (ref 3.87–5.11)
RDW: 14 % (ref 11.5–15.5)
WBC: 7.3 10*3/uL (ref 4.0–10.5)
nRBC: 0 % (ref 0.0–0.2)

## 2022-07-27 LAB — COMPREHENSIVE METABOLIC PANEL
ALT: 25 U/L (ref 0–44)
AST: 22 U/L (ref 15–41)
Albumin: 4.2 g/dL (ref 3.5–5.0)
Alkaline Phosphatase: 63 U/L (ref 38–126)
Anion gap: 10 (ref 5–15)
BUN: 11 mg/dL (ref 6–20)
CO2: 20 mmol/L — ABNORMAL LOW (ref 22–32)
Calcium: 8.7 mg/dL — ABNORMAL LOW (ref 8.9–10.3)
Chloride: 107 mmol/L (ref 98–111)
Creatinine, Ser: 0.67 mg/dL (ref 0.44–1.00)
GFR, Estimated: >60 mL/min (ref >60)
Glucose, Bld: 117 mg/dL — ABNORMAL HIGH (ref 70–99)
Potassium: 3.9 mmol/L (ref 3.5–5.1)
Sodium: 137 mmol/L (ref 135–145)
Total Bilirubin: 1.5 mg/dL — ABNORMAL HIGH (ref 0.3–1.2)
Total Protein: 7.9 g/dL (ref 6.5–8.1)

## 2022-07-27 LAB — TROPONIN I (HIGH SENSITIVITY)
Troponin I (High Sensitivity): 5 ng/L (ref <18)
Troponin I (High Sensitivity): 5 ng/L (ref <18)

## 2022-07-27 LAB — BRAIN NATRIURETIC PEPTIDE: B Natriuretic Peptide: 25.2 pg/mL (ref 0.0–100.0)

## 2022-07-27 MED ORDER — LIDOCAINE 5 % EX PTCH
1.0000 | MEDICATED_PATCH | CUTANEOUS | Status: DC
  Administered 2022-07-27: 1 via TRANSDERMAL
  Filled 2022-07-27: qty 1

## 2022-07-27 MED ORDER — PREDNISONE 10 MG (21) PO TBPK: ORAL_TABLET | ORAL | 0 refills | Status: DC

## 2022-07-27 MED ORDER — IPRATROPIUM-ALBUTEROL 0.5-2.5 (3) MG/3ML IN SOLN
3.0000 mL | Freq: Once | RESPIRATORY_TRACT | Status: AC
  Administered 2022-07-27: 3 mL via RESPIRATORY_TRACT
  Filled 2022-07-27: qty 3

## 2022-07-27 MED ORDER — OXYCODONE HCL 5 MG PO TABS: 5.0000 mg | ORAL_TABLET | Freq: Once | ORAL | Status: DC

## 2022-07-27 MED ORDER — CYCLOBENZAPRINE HCL 5 MG PO TABS: 5.0000 mg | ORAL_TABLET | Freq: Every evening | ORAL | 0 refills | Status: AC | PRN

## 2022-07-27 MED ORDER — CYCLOBENZAPRINE HCL 10 MG PO TABS
5.0000 mg | ORAL_TABLET | Freq: Once | ORAL | Status: AC
  Administered 2022-07-27: 5 mg via ORAL
  Filled 2022-07-27: qty 1

## 2022-07-27 MED ORDER — ACETAMINOPHEN 500 MG PO TABS
1000.0000 mg | ORAL_TABLET | Freq: Once | ORAL | Status: AC
  Administered 2022-07-27: 1000 mg via ORAL
  Filled 2022-07-27: qty 2

## 2022-07-27 MED ORDER — KETOROLAC TROMETHAMINE 15 MG/ML IJ SOLN
15.0000 mg | Freq: Once | INTRAMUSCULAR | Status: AC
  Administered 2022-07-27: 15 mg via INTRAVENOUS
  Filled 2022-07-27: qty 1

## 2022-07-27 MED ORDER — PANTOPRAZOLE SODIUM 20 MG PO TBEC: 20.0000 mg | DELAYED_RELEASE_TABLET | Freq: Every day | ORAL | 0 refills | Status: DC

## 2022-07-27 MED ORDER — ONDANSETRON HCL 4 MG/2ML IJ SOLN
4.0000 mg | Freq: Once | INTRAMUSCULAR | Status: AC
  Administered 2022-07-27: 4 mg via INTRAVENOUS
  Filled 2022-07-27: qty 2

## 2022-07-27 MED ORDER — IOHEXOL 350 MG/ML SOLN
100.0000 mL | Freq: Once | INTRAVENOUS | Status: AC | PRN
  Administered 2022-07-27: 100 mL via INTRAVENOUS

## 2022-07-27 MED ORDER — LIDOCAINE 5 % EX PTCH: 1.0000 | MEDICATED_PATCH | Freq: Two times a day (BID) | CUTANEOUS | 0 refills | Status: DC

## 2022-07-27 MED ORDER — LIDOCAINE 5 % EX PTCH: 1.0000 | MEDICATED_PATCH | Freq: Two times a day (BID) | CUTANEOUS | 0 refills | Status: AC

## 2022-07-27 MED ORDER — HYDROMORPHONE HCL 1 MG/ML IJ SOLN
0.5000 mg | Freq: Once | INTRAMUSCULAR | Status: AC
  Administered 2022-07-27: 0.5 mg via INTRAVENOUS
  Filled 2022-07-27: qty 0.5

## 2022-07-27 MED ORDER — IBUPROFEN 600 MG PO TABS: 600.0000 mg | ORAL_TABLET | Freq: Three times a day (TID) | ORAL | 0 refills | Status: DC | PRN

## 2022-07-27 NOTE — Discharge Instructions (Addendum)
Tylenol should be limited to no more than 2 g in a day due to the concern with your liver.  Take the steroids to try to help with inflammation. Protonix to help decrease any risk for developing any kind of bleeding.  If you develop any black tarry stools return to the ER.  You should call the GI doctor to make a follow-up for your liver.  Recommend a further workup for this.    4. Mild degenerative changes of the cervical spine. 5. 2.3 cm incidental right thyroid nodule. Recommend non-emergent thyroid ultrasound. 1. No acute intrathoracic, abdominal, or pelvic pathology. No aortic dissection or aneurysm. 2. Cirrhosis. 3. Sigmoid diverticulosis. No bowel obstruction. 4. Enlarged myomatous uterus. 5. A 4 mm left upper lobe nodule. No follow-up needed if patient is low-risk.This recommendation follows the consensus statement: Guidelines for Management of Incidental Pulmonary Nodules Detected on CT Images: From the Fleischner Society 2017; Radiology 2017; 284:228-243. 6.  Emphysema (ICD10-J43.9).

## 2022-07-27 NOTE — ED Notes (Signed)
ED Provider at bedside. 

## 2022-07-27 NOTE — ED Provider Notes (Signed)
[facility] Provider Note    Event Date/Time   First MD Initiated Contact with Patient 07/27/22 2005     (approximate)   History   Hypertension and Chest Pain   HPI  Tracy Hood is a 62 y.o. female with COPD on 3 L of oxygen at baseline who comes in with concern for left shoulder pain.  Patient reports she has had some intermittent issues with some left arm pain for over a month now however she had acute worsening 3 days ago where she had increasing pain radiating down the left arm as well as a little bit of tingling in the forearm.  She denies any shortness of breath reports a history of COPD and that she supposed to be on 3 L at baseline but occasionally does not wear it.  With EMS her oxygen level was 88% but she had not been wearing her oxygen as she is supposed to.  She reports the pain is worse with certain movements of her arm.  She denies this happening previously.  She denies do anything that could have pulled the arm.   Physical Exam   Triage Vital Signs: Blood pressure (!) 196/95, pulse 88, temperature 98.2 F (36.8 C), resp. rate 11, SpO2 94 %.  Most recent vital signs: Vitals:   07/27/22 2012  BP: (!) 196/95  Pulse: 88  Resp: 11  Temp: 98.2 F (36.8 C)  SpO2: 93%     General: Awake, no distress.  CV:  Good peripheral perfusion.  Resp:  Normal effort.  Abd:  No distention.  Other:  Patient has good distal pulses.  She has reports some tingling in the left arm near forearm (not the whole arm) median, radial, ulnar nerve are all intact.  2+ distal pulse.  Able to flex and extend the wrist.  No obvious weakness noted.  This been going on for few days.  Pain seems to be worse with movements. No CTL spine tenderness.  ED Results / Procedures / Treatments   Labs (all labs ordered are listed, but only abnormal results are displayed) Labs Reviewed  CBC WITH DIFFERENTIAL/PLATELET - Abnormal; Notable for the following components:       Result Value   RBC 5.48 (*)    Hemoglobin 15.1 (*)    All other components within normal limits  COMPREHENSIVE METABOLIC PANEL - Abnormal; Notable for the following components:   CO2 20 (*)    Glucose, Bld 117 (*)    Calcium 8.7 (*)    Total Bilirubin 1.5 (*)    All other components within normal limits  RESP PANEL BY RT-PCR (RSV, FLU A&B, COVID)  RVPGX2  BRAIN NATRIURETIC PEPTIDE  TROPONIN I (HIGH SENSITIVITY)  TROPONIN I (HIGH SENSITIVITY)     EKG  My interpretation of EKG:  EKG is normal sinus rate of 86 without any ST elevation or T wave inversions normal intervals  RADIOLOGY I have reviewed the xray personally and interpretted  no widen mediastinum   PROCEDURES:  Critical Care performed: No  Procedures   MEDICATIONS ORDERED IN ED: Medications  HYDROmorphone (DILAUDID) injection 0.5 mg (has no administration in time range)  ondansetron (ZOFRAN) injection 4 mg (has no administration in time range)  lidocaine (LIDODERM) 5 % 1 patch (has no administration in time range)  ipratropium-albuterol (DUONEB) 0.5-2.5 (3) MG/3ML nebulizer solution 3 mL (has no administration in time range)     IMPRESSION / MDM / ASSESSMENT AND PLAN / ED COURSE  I  reviewed the triage vital signs and the nursing notes.   Patient's presentation is most consistent with acute presentation with potential threat to life or bodily function.   Patient comes in with severe left arm pain with tingling.  No swelling of the arm to suggest DVT good radial pulse.  I suspect more likely musculoskeletal since the pain seems worse with certain movements.  She is significantly hypertensive however she reports pain radiating into her back so we will get CT dissection to ensure no evidence of dissection.  CT head given she reports a little bit of numbness and weakness although this has been ongoing for some time will rule out intracranial hemorrhage CT cervical to make sure there is no obvious fracture that is  causing some kind of compression on the nerve.  Patient given some IV Dilaudid lidocaine patch to try to help with pain while awaiting imaging.  Is unclear whether or not patient is typically on oxygen or not.  She initially told me she supposed to be on 3 L but she does not always wear them  Troponins x 2 are negative.  And her symptoms have been going on for some time now.  Her CBC is reassuring.  CMP reassuring slight elevation of total bilirubin.  COVID, flu are negative.  CT head is negative there is some incidental findings of thyroid nodule which I discussed with patient  CT scan of the dissection was also negative also some incidental findings of cirrhosis patient denies any daily alcohol use IV drug use.  Her T. bili is slightly elevated but LFTs are otherwise normal.  Did discuss these findings with patient and she can follow these up outpatient.  We also discussed incidental nodules.  Per patient provided copy of report.  I had extensive conversation with patient about her low oxygen which is on the oxygen try to figure out if this is new or old given patient known emphysema.  She reports that she denies any new shortness of breath or cough and that she is supposed to be wearing oxygen and just does not typically wear it and says she does not feel like the oxygen is related to her symptoms right now.  On repeat evaluation she denies any numbness or tingling.  She got good grip strength.  No urinating or defecating on herself no evidence of cord compression.  Her pain seems to be more musculoskeletal in nature.  We discussed treatment with  pain patches, Flexeril, steroids in case it could be a pinched nerve.  She expressed understanding and felt comfortable with this plan.  I did call the radiologist to discuss the cirrhosis and he stated that they just noticed some more nodules but they are not 100% sure if it is cirrhosis or not recommend following up for elastography.  Will give her GI's  number and she expressed understanding.  We discussed Tylenol max of 2000 mg.  I suspect that her elevated blood pressure could be related to the pain and she will follow this up with her primary care doctor.  She reports that when she sits still when she gets in the right position she does not have pain but then when she tries to move that is when the pain flares up so this seems to be more related to muscle.  She has pain when she tries to lift her arm.  There is no redness or warmth over any of the joints and good distal pulses.  The patient is on  the cardiac monitor to evaluate for evidence of arrhythmia and/or significant heart rate changes.      FINAL CLINICAL IMPRESSION(S) / ED DIAGNOSES   Final diagnoses:  Hypertension, unspecified type  Muscle pain     Rx / DC Orders   ED Discharge Orders          Ordered    lidocaine (LIDODERM) 5 %  Every 12 hours,   Status:  Discontinued        07/27/22 2237    predniSONE (STERAPRED UNI-PAK 21 TAB) 10 MG (21) TBPK tablet  Status:  Discontinued        07/27/22 2237    ibuprofen (ADVIL) 600 MG tablet  Every 8 hours PRN,   Status:  Discontinued        07/27/22 2237    pantoprazole (PROTONIX) 20 MG tablet  Daily,   Status:  Discontinued        07/27/22 2237    lidocaine (LIDODERM) 5 %  Every 12 hours        07/27/22 2239    pantoprazole (PROTONIX) 20 MG tablet  Daily        07/27/22 2239    predniSONE (STERAPRED UNI-PAK 21 TAB) 10 MG (21) TBPK tablet        07/27/22 2239    cyclobenzaprine (FLEXERIL) 5 MG tablet  At bedtime PRN        07/27/22 2239             Note:  This document was prepared using Dragon voice recognition software and may include unintentional dictation errors.   Vanessa Pope, MD 07/27/22 [phone]

## 2022-07-27 NOTE — ED Notes (Signed)
Patient transported to CT 

## 2022-07-27 NOTE — ED Notes (Signed)
Patient transported back from CT

## 2022-07-27 NOTE — ED Triage Notes (Signed)
Pt c/o HTN and cp intermittently for 3 days and sts that she is having the pain radiating to the L arm. Pt does have a hx of HTN and sts she has been taking her medications regularly. Pt was also hypoxic for EMS at 88%. Denies difficulty breathing.

## 2022-07-27 NOTE — ED Notes (Signed)
Patient discharged at this time. Ambulated to lobby with independent and steady gait. Breathing unlabored speaking in full sentences. Verbalized understanding of all discharge, follow up, and medication teaching. Discharged homed with all belongings.   

## 2022-08-01 NOTE — Progress Notes (Deleted)
Referring Physician:  Vanessa [place] [address], MD [place],  [place] [address]  Primary Physician:  Mechele Claude, FNP  History of Present Illness: 08/06/2022*** Tracy Hood has a history of HTN and COPD (on 3L O2 but she does not always wear), DM, and hyperlipidemia.   Seen in ED on 07/27/22 for left shoulder pain that went to her forearm along with tingling.   She was given flexeril, lidoderm patches, and prednisone from the ED.    Refer back to PCP for thyroid US?***   Duration: *** Location: *** Quality: *** Severity: ***  Precipitating: aggravated by *** Modifying factors: made better by *** Weakness: none Timing: *** Bowel/Bladder Dysfunction: none  Conservative measures:  Physical therapy: ***  Multimodal medical therapy including regular antiinflammatories: flexeril, lidoderm, prednisone  Injections: *** epidural steroid injections  Past Surgery: ***  Tracy Hood has ***no symptoms of cervical myelopathy.  The symptoms are causing a significant impact on the patient's life.   Review of Systems:  A 10 point review of systems is negative, except for the pertinent positives and negatives detailed in the HPI.  Past Medical History: Past Medical History:  Diagnosis Date   Anxiety    Arthritis    COPD (chronic obstructive pulmonary disease) ([place])    Diabetes mellitus without complication (HCC)    type 2   Dyspnea    Hypertension    IBS (irritable bowel syndrome)    Obesity    Sleep apnea    STARTED USING CPAP 3 MONTHS AGO    Past Surgical History: Past Surgical History:  Procedure Laterality Date   COLONOSCOPY WITH PROPOFOL N/A 12/05/2018   Procedure: COLONOSCOPY WITH PROPOFOL;  Surgeon: Lollie Sails, MD;  Location: [facility] ENDOSCOPY;  Service: Endoscopy;  Laterality: N/A;   IR RADIOLOGIST EVAL & MGMT  02/05/2018   JOINT REPLACEMENT Left 2018   tkr   LAPAROSCOPIC APPENDECTOMY N/A 01/06/2019   Procedure: APPENDECTOMY  LAPAROSCOPIC, DIABETIC;  Surgeon: Benjamine Sprague, DO;  Location: [facility] ORS;  Service: General;  Laterality: N/A;   TUBAL LIGATION  1990    Allergies: Allergies as of 08/09/2022 - Review Complete 07/27/2022  Allergen Reaction Noted   Ace inhibitors  08/19/2019   Sulfa antibiotics Itching 01/28/2018    Medications: Outpatient Encounter Medications as of 08/09/2022  Medication Sig   amitriptyline (ELAVIL) 25 MG tablet Take 50 mg by mouth at bedtime as needed for sleep.    amLODipine (NORVASC) 10 MG tablet Take 10 mg by mouth daily.   budesonide-formoterol (SYMBICORT) 160-4.5 MCG/ACT inhaler Inhale 1 puff into the lungs 2 (two) times daily.   [EXPIRED] cyclobenzaprine (FLEXERIL) 5 MG tablet Take 1 tablet (5 mg total) by mouth at bedtime as needed for up to 5 days for muscle spasms.   doxazosin (CARDURA) 2 MG tablet Take 2 mg by mouth at bedtime.    furosemide (LASIX) 40 MG tablet Take 40 mg by mouth daily as needed for fluid.   guaiFENesin-codeine 100-10 MG/5ML syrup Take 5 mLs by mouth every 6 (six) hours as needed for cough.   [EXPIRED] lidocaine (LIDODERM) 5 % Place 1 patch onto the skin every 12 (twelve) hours for 5 days. Remove & Discard patch within 12 hours or as directed by MD and leave off 12 hours before placing new patch   medroxyPROGESTERone (PROVERA) 2.5 MG tablet Take 1 tablet (2.5 mg total) by mouth daily.   Multiple Vitamins-Minerals (MULTIVITAMIN WOMEN 50+ PO) Take 1 tablet by mouth daily.  pantoprazole (PROTONIX) 20 MG tablet Take 1 tablet (20 mg total) by mouth daily for 10 days.   predniSONE (STERAPRED UNI-PAK 21 TAB) 10 MG (21) TBPK tablet Take as packaging directs   rosuvastatin (CRESTOR) 40 MG tablet Take 40 mg by mouth daily.    Semaglutide (RYBELSUS PO) Take 1 tablet by mouth daily.   tiotropium (SPIRIVA) 18 MCG inhalation capsule Place 18 mcg into inhaler and inhale daily.   VIBERZI 100 MG TABS Take 1 tablet by mouth 2 (two) times daily.   No facility-administered  encounter medications on file as of 08/09/2022.    Social History: Social History   Tobacco Use   Smoking status: Former    Types: Cigarettes    Quit date: 2019    Years since quitting: 5.0   Smokeless tobacco: Never  Vaping Use   Vaping Use: Never used  Substance Use Topics   Alcohol use: Yes    Alcohol/week: 4.0 standard drinks of alcohol    Types: 4 Glasses of wine per week   Drug use: Never    Family Medical History: Family History  Problem Relation Age of Onset   Colon cancer Sister     Physical Examination: There were no vitals filed for this visit.  General: Patient is well developed, well nourished, calm, collected, and in no apparent distress. Attention to examination is appropriate.  Respiratory: Patient is breathing without any difficulty.   NEUROLOGICAL:     Awake, alert, oriented to person, place, and time.  Speech is clear and fluent. Fund of knowledge is appropriate.   Cranial Nerves: Pupils equal round and reactive to light.  Facial tone is symmetric.    *** ROM of cervical spine *** pain *** posterior cervical tenderness. *** tenderness in bilateral trapezial region.   *** ROM of lumbar spine *** pain *** posterior lumbar tenderness.   No abnormal lesions on exposed skin.   Strength: Side Biceps Triceps Deltoid Interossei Grip Wrist Ext. Wrist Flex.  R '5 5 5 5 5 5 5  '$ L '5 5 5 5 5 5 5   '$ Side Iliopsoas Quads Hamstring PF DF EHL  R '5 5 5 5 5 5  '$ L '5 5 5 5 5 5   '$ Reflexes are ***2+ and symmetric at the biceps, triceps, brachioradialis, patella and achilles.   Hoffman's is absent.  Clonus is not present.   Bilateral upper and lower extremity sensation is intact to light touch.     Gait is normal.   ***No difficulty with tandem gait.    Medical Decision Making  Imaging: CT of cervical spine and head dated 07/27/22:  IMPRESSION: 1. No acute intracranial process. 2. Mild chronic small vessel ischemic changes. 3. No acute fracture or traumatic  subluxation of the cervical spine. 4. Mild degenerative changes of the cervical spine. 5. 2.3 cm incidental right thyroid nodule. Recommend non-emergent thyroid ultrasound. Reference: J Am Coll Radiol. 2015 Feb;12(2): 143-50     Electronically Signed   By: Ronney Asters M.D.   On: 07/27/2022 21:44  I have personally reviewed the images and agree with the above interpretation.  Assessment and Plan: Tracy Hood is a pleasant 62 y.o. female has ***  Treatment options discussed with patient and following plan made:   - Order for physical therapy for *** spine ***. Patient to call to schedule appointment. *** - Continue current medications including ***. Reviewed dosing and side effects.  - Prescription for ***. Reviewed dosing and side effects. Take with food.  -  Prescription for *** to take prn muscle spasms. Reviewed dosing and side effects. Discussed this can cause drowsiness.  - MRI of *** to further evaluate *** radiculopathy. No improvement time or medications (***).  - Referral to PMR at [facility] to discuss possible *** injections.  - Will schedule phone visit to review MRI results once I get them back.   I spent a total of *** minutes in face-to-face and non-face-to-face activities related to this patient's care today including review of outside records, review of imaging, review of symptoms, physical exam, discussion of differential diagnosis, discussion of treatment options, and documentation.   Thank you for involving me in the care of this patient.   Geronimo Boot PA-C Dept. of Neurosurgery

## 2022-08-02 ENCOUNTER — Ambulatory Visit: Payer: Medicare HMO | Admitting: Podiatry

## 2022-08-02 DIAGNOSIS — J449 Chronic obstructive pulmonary disease, unspecified: Secondary | ICD-10-CM | POA: Diagnosis not present

## 2022-08-02 DIAGNOSIS — I10 Essential (primary) hypertension: Secondary | ICD-10-CM | POA: Diagnosis not present

## 2022-08-02 DIAGNOSIS — R6889 Other general symptoms and signs: Secondary | ICD-10-CM | POA: Diagnosis not present

## 2022-08-02 DIAGNOSIS — E782 Mixed hyperlipidemia: Secondary | ICD-10-CM | POA: Diagnosis not present

## 2022-08-02 DIAGNOSIS — M170 Bilateral primary osteoarthritis of knee: Secondary | ICD-10-CM | POA: Diagnosis not present

## 2022-08-02 DIAGNOSIS — G990 Autonomic neuropathy in diseases classified elsewhere: Secondary | ICD-10-CM | POA: Diagnosis not present

## 2022-08-09 ENCOUNTER — Ambulatory Visit: Payer: Medicare HMO | Admitting: Orthopedic Surgery

## 2022-08-16 ENCOUNTER — Encounter: Payer: Self-pay | Admitting: Obstetrics and Gynecology

## 2022-08-16 ENCOUNTER — Ambulatory Visit (INDEPENDENT_AMBULATORY_CARE_PROVIDER_SITE_OTHER): Payer: Medicare HMO | Admitting: Obstetrics and Gynecology

## 2022-08-16 VITALS — BP 152/89 | HR 72 | Ht 67.0 in | Wt 256.3 lb

## 2022-08-16 DIAGNOSIS — R6889 Other general symptoms and signs: Secondary | ICD-10-CM | POA: Diagnosis not present

## 2022-08-16 DIAGNOSIS — D219 Benign neoplasm of connective and other soft tissue, unspecified: Secondary | ICD-10-CM | POA: Diagnosis not present

## 2022-08-16 DIAGNOSIS — N950 Postmenopausal bleeding: Secondary | ICD-10-CM

## 2022-08-16 NOTE — Progress Notes (Signed)
HPI:      Tracy Hood is a 62 y.o. G5P0 who LMP was No LMP recorded. Patient is postmenopausal.  Subjective:   She presents today complaining of 2 episodes of postmenopausal bleeding in the last year and a half.  She reports the bleeding is light and lasting 1 and half to 2 days. She had the same issue a year and a half ago and had an endometrial biopsy which proved to be negative. She is known to have a very large fibroid.    Hx: The following portions of the patient's history were reviewed and updated as appropriate:             She  has a past medical history of Anxiety, Arthritis, COPD (chronic obstructive pulmonary disease) ([place]), Diabetes mellitus without complication ([place]), Dyspnea, Hypertension, IBS (irritable bowel syndrome), Obesity, and Sleep apnea. She does not have any pertinent problems on file. She  has a past surgical history that includes IR Radiologist Eval & Mgmt (02/05/2018); Tubal ligation (1990); Colonoscopy with propofol (N/A, 12/05/2018); Joint replacement (Left, 2018); and laparoscopic appendectomy (N/A, 01/06/2019). Her family history includes Colon cancer in her sister. She  reports that she quit smoking about 5 years ago. Her smoking use included cigarettes. She has never used smokeless tobacco. She reports current alcohol use of about 4.0 standard drinks of alcohol per week. She reports that she does not use drugs. She has a current medication list which includes the following prescription(s): amitriptyline, amlodipine, multiple vitamins-minerals, rosuvastatin, semaglutide, and pantoprazole. She is allergic to ace inhibitors and sulfa antibiotics.       Review of Systems:  Review of Systems  Constitutional: Denied constitutional symptoms, night sweats, recent illness, fatigue, fever, insomnia and weight loss.  Eyes: Denied eye symptoms, eye pain, photophobia, vision change and visual disturbance.  Ears/Nose/Throat/Neck: Denied ear, nose, throat or neck  symptoms, hearing loss, nasal discharge, sinus congestion and sore throat.  Cardiovascular: Denied cardiovascular symptoms, arrhythmia, chest pain/pressure, edema, exercise intolerance, orthopnea and palpitations.  Respiratory: Denied pulmonary symptoms, asthma, pleuritic pain, productive sputum, cough, dyspnea and wheezing.  Gastrointestinal: Denied, gastro-esophageal reflux, melena, nausea and vomiting.  Genitourinary: See HPI for additional information.  Musculoskeletal: Denied musculoskeletal symptoms, stiffness, swelling, muscle weakness and myalgia.  Dermatologic: Denied dermatology symptoms, rash and scar.  Neurologic: Denied neurology symptoms, dizziness, headache, neck pain and syncope.  Psychiatric: Denied psychiatric symptoms, anxiety and depression.  Endocrine: Denied endocrine symptoms including hot flashes and night sweats.   Meds:   Current Outpatient Medications on File Prior to Visit  Medication Sig Dispense Refill   amitriptyline (ELAVIL) 25 MG tablet Take 50 mg by mouth at bedtime as needed for sleep.      amLODipine (NORVASC) 10 MG tablet Take 10 mg by mouth daily.     Multiple Vitamins-Minerals (MULTIVITAMIN WOMEN 50+ PO) Take 1 tablet by mouth daily.     rosuvastatin (CRESTOR) 40 MG tablet Take 40 mg by mouth daily.      Semaglutide (RYBELSUS PO) Take 1 tablet by mouth daily.     pantoprazole (PROTONIX) 20 MG tablet Take 1 tablet (20 mg total) by mouth daily for 10 days. 10 tablet 0   No current facility-administered medications on file prior to visit.      Objective:     Vitals:   08/16/22 0913  BP: (!) 152/89  Pulse: 72   Filed Weights   08/16/22 0913  Weight: 256 lb 4.8 oz (116.3 kg)  Previous ultrasound results and endometrial biopsy results reviewed          Assessment:    G5P0 Patient Active Problem List   Diagnosis Date Noted   Acute hypoxemic respiratory failure ([place]) 08/17/2019   Acute respiratory failure with hypoxia ([place])  08/16/2019   COPD with acute exacerbation ([place]) 08/16/2019   Hyperlipidemia associated with type 2 diabetes mellitus ([place]) 08/16/2019   Diabetes ([place]) 02/03/2018   Hypertension associated with diabetes ([place]) 02/03/2018   Appendicitis with peritoneal abscess 01/28/2018     1. Postmenopausal bleeding   2. Fibroids     I think it very unlikely that with her 2 short episodes of postmenopausal bleeding and a negative endometrial biopsy a year and a half ago that she could have developed cancer in that time.  In addition she states she intermittently takes steroids.  I am unable to correlate the steroid use with her bleeding but it is certainly a possibility. She is understandably not very enthusiastic about a repeat endometrial biopsy.   Plan:            1.  We have talked through multiple options and at this time we will continue expectant management knowing that she had a negative endometrial biopsy 1 and half years ago.  She will report any further postmenopausal bleeding and if she continues to experience that we will first do an ultrasound for endometrial thickness followed by biopsy if necessary.  Orders No orders of the defined types were placed in this encounter.   No orders of the defined types were placed in this encounter.     F/U  Return for Annual Physical. I spent 23 minutes involved in the care of this patient preparing to see the patient by obtaining and reviewing her medical history (including labs, imaging tests and prior procedures), documenting clinical information in the electronic health record (EHR), counseling and coordinating care plans, writing and sending prescriptions, ordering tests or procedures and in direct communicating with the patient and medical staff discussing pertinent items from her history and physical exam.  Finis Bud, M.D. 08/16/2022 9:44 AM

## 2022-08-16 NOTE — Progress Notes (Signed)
Patient presents today to discuss occurrence of postmenopausal bleeding. She states the week of Christmas she experienced a week of bleeding that started bright red and heavy and tapered off to light bleeding along with moderate cramping. She states no occurrences since christmas but did have a few last year. No additional concerns.

## 2022-08-22 ENCOUNTER — Encounter: Payer: Medicare HMO | Admitting: Obstetrics and Gynecology

## 2022-08-27 NOTE — Progress Notes (Signed)
Test covid/BNP order due to concern for SOB

## 2022-11-01 ENCOUNTER — Ambulatory Visit: Payer: Medicare HMO | Admitting: Family

## 2022-12-11 ENCOUNTER — Encounter: Payer: Self-pay | Admitting: Family

## 2022-12-11 ENCOUNTER — Ambulatory Visit (INDEPENDENT_AMBULATORY_CARE_PROVIDER_SITE_OTHER): Payer: Medicare HMO | Admitting: Family

## 2022-12-11 VITALS — BP 128/64 | HR 78 | Ht 67.0 in | Wt 247.8 lb

## 2022-12-11 DIAGNOSIS — J441 Chronic obstructive pulmonary disease with (acute) exacerbation: Secondary | ICD-10-CM

## 2022-12-11 DIAGNOSIS — E1169 Type 2 diabetes mellitus with other specified complication: Secondary | ICD-10-CM

## 2022-12-11 DIAGNOSIS — E559 Vitamin D deficiency, unspecified: Secondary | ICD-10-CM | POA: Diagnosis not present

## 2022-12-11 DIAGNOSIS — E785 Hyperlipidemia, unspecified: Secondary | ICD-10-CM

## 2022-12-11 DIAGNOSIS — R6889 Other general symptoms and signs: Secondary | ICD-10-CM | POA: Diagnosis not present

## 2022-12-11 DIAGNOSIS — E039 Hypothyroidism, unspecified: Secondary | ICD-10-CM

## 2022-12-11 DIAGNOSIS — E1159 Type 2 diabetes mellitus with other circulatory complications: Secondary | ICD-10-CM | POA: Diagnosis not present

## 2022-12-11 DIAGNOSIS — E538 Deficiency of other specified B group vitamins: Secondary | ICD-10-CM

## 2022-12-11 DIAGNOSIS — E1165 Type 2 diabetes mellitus with hyperglycemia: Secondary | ICD-10-CM

## 2022-12-11 DIAGNOSIS — I152 Hypertension secondary to endocrine disorders: Secondary | ICD-10-CM

## 2022-12-11 LAB — POC CREATINE & ALBUMIN,URINE
Albumin/Creatinine Ratio, Urine, POC: >300
Creatinine, POC: 200 mg/dL
Microalbumin Ur, POC: 150 mg/L

## 2022-12-11 LAB — POCT CBG (FASTING - GLUCOSE)-MANUAL ENTRY: Glucose Fasting, POC: 95 mg/dL (ref 70–99)

## 2022-12-11 MED ORDER — RYBELSUS 14 MG PO TABS: 14.0000 mg | ORAL_TABLET | Freq: Every morning | ORAL | 1 refills | Status: DC

## 2022-12-11 MED ORDER — ALBUTEROL SULFATE HFA 108 (90 BASE) MCG/ACT IN AERS: 2.0000 | INHALATION_SPRAY | Freq: Four times a day (QID) | RESPIRATORY_TRACT | 2 refills | Status: DC | PRN

## 2022-12-11 MED ORDER — ROSUVASTATIN CALCIUM 40 MG PO TABS: 40.0000 mg | ORAL_TABLET | Freq: Every day | ORAL | 1 refills | Status: DC

## 2022-12-11 MED ORDER — TRELEGY ELLIPTA 100-62.5-25 MCG/ACT IN AEPB: INHALATION_SPRAY | RESPIRATORY_TRACT | 1 refills | Status: DC

## 2022-12-11 MED ORDER — AMLODIPINE BESYLATE 10 MG PO TABS: 10.0000 mg | ORAL_TABLET | Freq: Every day | ORAL | 1 refills | Status: DC

## 2022-12-11 MED ORDER — AMITRIPTYLINE HCL 25 MG PO TABS: 50.0000 mg | ORAL_TABLET | Freq: Every evening | ORAL | 1 refills | Status: DC | PRN

## 2022-12-11 MED ORDER — ICOSAPENT ETHYL 1 G PO CAPS: 2.0000 g | ORAL_CAPSULE | Freq: Two times a day (BID) | ORAL | 5 refills | Status: AC

## 2022-12-12 ENCOUNTER — Encounter: Payer: Self-pay | Admitting: Family

## 2022-12-12 DIAGNOSIS — E559 Vitamin D deficiency, unspecified: Secondary | ICD-10-CM | POA: Insufficient documentation

## 2022-12-12 LAB — CMP14+EGFR
ALT: 16 IU/L (ref 0–32)
AST: 15 IU/L (ref 0–40)
Albumin/Globulin Ratio: 1.4 (ref 1.2–2.2)
Albumin: 4.2 g/dL (ref 3.8–4.9)
Alkaline Phosphatase: 88 IU/L (ref 44–121)
BUN/Creatinine Ratio: 12 (ref 9–23)
BUN: 10 mg/dL (ref 6–24)
Bilirubin Total: 0.7 mg/dL (ref 0.0–1.2)
CO2: 23 mmol/L (ref 20–29)
Calcium: 9.6 mg/dL (ref 8.7–10.2)
Chloride: 103 mmol/L (ref 96–106)
Creatinine, Ser: 0.83 mg/dL (ref 0.57–1.00)
Globulin, Total: 3.1 g/dL (ref 1.5–4.5)
Glucose: 99 mg/dL (ref 70–99)
Potassium: 4.3 mmol/L (ref 3.5–5.2)
Sodium: 140 mmol/L (ref 134–144)
Total Protein: 7.3 g/dL (ref 6.0–8.5)
eGFR: 81 mL/min/{1.73_m2} (ref >59)

## 2022-12-12 LAB — HEMOGLOBIN A1C
Est. average glucose Bld gHb Est-mCnc: 126 mg/dL
Hgb A1c MFr Bld: 6 % — ABNORMAL HIGH (ref 4.8–5.6)

## 2022-12-12 LAB — CBC WITH DIFFERENTIAL
Basophils Absolute: 0.1 10*3/uL (ref 0.0–0.2)
Basos: 1 %
EOS (ABSOLUTE): 0.3 10*3/uL (ref 0.0–0.4)
Eos: 5 %
Hematocrit: 49.5 % — ABNORMAL HIGH (ref 34.0–46.6)
Hemoglobin: 16.1 g/dL — ABNORMAL HIGH (ref 11.1–15.9)
Immature Grans (Abs): 0 10*3/uL (ref 0.0–0.1)
Immature Granulocytes: 0 %
Lymphocytes Absolute: 2.4 10*3/uL (ref 0.7–3.1)
Lymphs: 40 %
MCH: 27.9 pg (ref 26.6–33.0)
MCHC: 32.5 g/dL (ref 31.5–35.7)
MCV: 86 fL (ref 79–97)
Monocytes Absolute: 0.4 10*3/uL (ref 0.1–0.9)
Monocytes: 7 %
Neutrophils Absolute: 2.8 10*3/uL (ref 1.4–7.0)
Neutrophils: 47 %
RBC: 5.77 x10E6/uL — ABNORMAL HIGH (ref 3.77–5.28)
RDW: 13.2 % (ref 11.7–15.4)
WBC: 6 10*3/uL (ref 3.4–10.8)

## 2022-12-12 LAB — LIPID PANEL
Chol/HDL Ratio: 3.2 ratio (ref 0.0–4.4)
Cholesterol, Total: 226 mg/dL — ABNORMAL HIGH (ref 100–199)
HDL: 71 mg/dL (ref >39)
LDL Chol Calc (NIH): 131 mg/dL — ABNORMAL HIGH (ref 0–99)
Triglycerides: 139 mg/dL (ref 0–149)
VLDL Cholesterol Cal: 24 mg/dL (ref 5–40)

## 2022-12-12 LAB — VITAMIN B12: Vitamin B-12: 277 pg/mL (ref 232–1245)

## 2022-12-12 LAB — VITAMIN D 25 HYDROXY (VIT D DEFICIENCY, FRACTURES): Vit D, 25-Hydroxy: 31 ng/mL (ref 30.0–100.0)

## 2022-12-12 LAB — TSH: TSH: 0.627 u[IU]/mL (ref 0.450–4.500)

## 2022-12-12 NOTE — Assessment & Plan Note (Signed)
Checking labs today.  Will continue supplements as needed.  

## 2022-12-12 NOTE — Progress Notes (Signed)
Established Patient Office Visit  Subjective:  Patient ID: Tracy Hood, female    DOB: 10/10/1960  Age: 62 y.o. MRN: [id number]  Chief Complaint  Patient presents with   Follow-up    3 month follow up    Patient is here today for her 3 months follow up.  She has been feeling fairly well since last appointment.   She does not have additional concerns to discuss today.  Labs are due today. She needs refills.   I have reviewed her active problem list, medication list, allergies, notes from last encounter, and lab results for her appointment today.      No other concerns at this time.   Past Medical History:  Diagnosis Date   Acute hypoxemic respiratory failure (HCC) 08/17/2019   Acute respiratory failure with hypoxia (HCC) 08/16/2019   Anxiety    Appendicitis with peritoneal abscess 01/28/2018   Arthritis    COPD (chronic obstructive pulmonary disease) (HCC)    Diabetes mellitus without complication (HCC)    type 2   Dyspnea    Hypertension    IBS (irritable bowel syndrome)    Obesity    Sleep apnea    STARTED USING CPAP 3 MONTHS AGO    Past Surgical History:  Procedure Laterality Date   COLONOSCOPY WITH PROPOFOL N/A 12/05/2018   Procedure: COLONOSCOPY WITH PROPOFOL;  Surgeon: Christena Deem, MD;  Location: [facility] ENDOSCOPY;  Service: Endoscopy;  Laterality: N/A;   IR RADIOLOGIST EVAL & MGMT  02/05/2018   JOINT REPLACEMENT Left 2018   tkr   LAPAROSCOPIC APPENDECTOMY N/A 01/06/2019   Procedure: APPENDECTOMY LAPAROSCOPIC, DIABETIC;  Surgeon: Sung Amabile, DO;  Location: [facility] ORS;  Service: General;  Laterality: N/A;   TUBAL LIGATION  1990    Social History   Socioeconomic History   Marital status: Single    Spouse name: Not on file   Number of children: 5   Years of education: Not on file   Highest education level: Not on file  Occupational History   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    Comment: disability  Tobacco Use   Smoking status: Former     Types: Cigarettes    Quit date: 2019    Years since quitting: 5.4   Smokeless tobacco: Never  Vaping Use   Vaping Use: Never used  Substance and Sexual Activity   Alcohol use: Yes    Alcohol/week: 4.0 standard drinks of alcohol    Types: 4 Glasses of wine per week   Drug use: Never   Sexual activity: Not Currently    Birth control/protection: None  Other Topics Concern   Not on file  Social History Narrative   Not on file   Social Determinants of Health   Financial Resource Strain: Not on file  Food Insecurity: Not on file  Transportation Needs: Not on file  Physical Activity: Not on file  Stress: Not on file  Social Connections: Not on file  Intimate Partner Violence: Not on file    Family History  Problem Relation Age of Onset   Colon cancer Sister     Allergies  Allergen Reactions   Ace Inhibitors     swelling of lip/face and closing of throat   Sulfa Antibiotics Itching    Review of Systems  All other systems reviewed and are negative.      Objective:   BP 128/64   Pulse 78   Ht 5\' 7"  (1.702 m)   Wt 247 lb 12.8 oz (112.4  kg)   SpO2 (!) 86%   BMI 38.81 kg/m   Vitals:   12/11/22 0922  BP: 128/64  Pulse: 78  Height: 5\' 7"  (1.702 m)  Weight: 247 lb 12.8 oz (112.4 kg)  SpO2: (!) 86%  BMI (Calculated): 38.8    Physical Exam Vitals and nursing note reviewed.  Constitutional:      Appearance: Normal appearance. She is normal weight.  HENT:     Head: Normocephalic.  Eyes:     Pupils: Pupils are equal, round, and reactive to light.  Cardiovascular:     Rate and Rhythm: Normal rate.  Pulmonary:     Effort: Pulmonary effort is normal.  Neurological:     Mental Status: She is alert.      Results for orders placed or performed in visit on 12/11/22  HM MAMMOGRAPHY  Result Value Ref Range   HM Mammogram 0-4 Bi-Rad 0-4 Bi-Rad, Self Reported Normal  Hemoglobin A1c  Result Value Ref Range   Hemoglobin A1C 6.2   Lipid panel  Result Value  Ref Range   Cholesterol, Total 226 (H) 100 - 199 mg/dL   Triglycerides 782 0 - 149 mg/dL   HDL 71 >95 mg/dL   VLDL Cholesterol Cal 24 5 - 40 mg/dL   LDL Chol Calc (NIH) 621 (H) 0 - 99 mg/dL   Chol/HDL Ratio 3.2 0.0 - 4.4 ratio  VITAMIN D 25 Hydroxy (Vit-D Deficiency, Fractures)  Result Value Ref Range   Vit D, 25-Hydroxy 31.0 30.0 - 100.0 ng/mL  CBC With Differential  Result Value Ref Range   WBC 6.0 3.4 - 10.8 x10E3/uL   RBC 5.77 (H) 3.77 - 5.28 x10E6/uL   Hemoglobin 16.1 (H) 11.1 - 15.9 g/dL   Hematocrit 30.8 (H) 65.7 - 46.6 %   MCV 86 79 - 97 fL   MCH 27.9 26.6 - 33.0 pg   MCHC 32.5 31.5 - 35.7 g/dL   RDW 84.6 96.2 - 95.2 %   Neutrophils 47 Not Estab. %   Lymphs 40 Not Estab. %   Monocytes 7 Not Estab. %   Eos 5 Not Estab. %   Basos 1 Not Estab. %   Neutrophils Absolute 2.8 1.4 - 7.0 x10E3/uL   Lymphocytes Absolute 2.4 0.7 - 3.1 x10E3/uL   Monocytes Absolute 0.4 0.1 - 0.9 x10E3/uL   EOS (ABSOLUTE) 0.3 0.0 - 0.4 x10E3/uL   Basophils Absolute 0.1 0.0 - 0.2 x10E3/uL   Immature Granulocytes 0 Not Estab. %   Immature Grans (Abs) 0.0 0.0 - 0.1 x10E3/uL  CMP14+EGFR  Result Value Ref Range   Glucose 99 70 - 99 mg/dL   BUN 10 6 - 24 mg/dL   Creatinine, Ser 8.41 0.57 - 1.00 mg/dL   eGFR 81 >32 GM/WNU/2.72   BUN/Creatinine Ratio 12 9 - 23   Sodium 140 134 - 144 mmol/L   Potassium 4.3 3.5 - 5.2 mmol/L   Chloride 103 96 - 106 mmol/L   CO2 23 20 - 29 mmol/L   Calcium 9.6 8.7 - 10.2 mg/dL   Total Protein 7.3 6.0 - 8.5 g/dL   Albumin 4.2 3.8 - 4.9 g/dL   Globulin, Total 3.1 1.5 - 4.5 g/dL   Albumin/Globulin Ratio 1.4 1.2 - 2.2   Bilirubin Total 0.7 0.0 - 1.2 mg/dL   Alkaline Phosphatase 88 44 - 121 IU/L   AST 15 0 - 40 IU/L   ALT 16 0 - 32 IU/L  TSH  Result Value Ref Range   TSH 0.627  0.450 - 4.500 uIU/mL  Hemoglobin A1c  Result Value Ref Range   Hgb A1c MFr Bld 6.0 (H) 4.8 - 5.6 %   Est. average glucose Bld gHb Est-mCnc 126 mg/dL  Vitamin Q65  Result Value Ref Range    Vitamin B-12 277 232 - 1,245 pg/mL  POCT CBG (Fasting - Glucose)  Result Value Ref Range   Glucose Fasting, POC 95 70 - 99 mg/dL  POC CREATINE & ALBUMIN,URINE  Result Value Ref Range   Microalbumin Ur, POC 150 mg/L   Creatinine, POC 200 mg/dL   Albumin/Creatinine Ratio, Urine, POC >300   Medicare Annual Wellness Visits  Result Value Ref Range   HM Annual Wellness Visit See Report (in chart)   HM DIABETES FOOT EXAM  Result Value Ref Range   HM Diabetic Foot Exam Done   HM COLONOSCOPY  Result Value Ref Range   HM Colonoscopy See Report (in chart) See Report (in chart), Patient Reported    Recent Results (from the past 2160 hour(s))  POCT CBG (Fasting - Glucose)     Status: Normal   Collection Time: 12/11/22  9:28 AM  Result Value Ref Range   Glucose Fasting, POC 95 70 - 99 mg/dL  POC CREATINE & ALBUMIN,URINE     Status: Abnormal   Collection Time: 12/11/22 10:18 AM  Result Value Ref Range   Microalbumin Ur, POC 150 mg/L   Creatinine, POC 200 mg/dL   Albumin/Creatinine Ratio, Urine, POC >300   Lipid panel     Status: Abnormal   Collection Time: 12/11/22 10:28 AM  Result Value Ref Range   Cholesterol, Total 226 (H) 100 - 199 mg/dL   Triglycerides 784 0 - 149 mg/dL   HDL 71 >69 mg/dL   VLDL Cholesterol Cal 24 5 - 40 mg/dL   LDL Chol Calc (NIH) 629 (H) 0 - 99 mg/dL   Chol/HDL Ratio 3.2 0.0 - 4.4 ratio    Comment:                                   T. Chol/HDL Ratio                                             Men  Women                               1/2 Avg.Risk  3.4    3.3                                   Avg.Risk  5.0    4.4                                2X Avg.Risk  9.6    7.1                                3X Avg.Risk 23.4   11.0   VITAMIN D 25 Hydroxy (Vit-D Deficiency, Fractures)     Status: None   Collection Time: 12/11/22 10:28 AM  Result Value Ref Range  Vit D, 25-Hydroxy 31.0 30.0 - 100.0 ng/mL    Comment: Vitamin D deficiency has been defined by the Institute  of Medicine and an Endocrine Society practice guideline as a level of serum 25-OH vitamin D less than 20 ng/mL (1,2). The Endocrine Society went on to further define vitamin D insufficiency as a level between 21 and 29 ng/mL (2). 1. IOM (Institute of Medicine). 2010. Dietary reference    intakes for calcium and D. [place]: The    [employer]. 2. Holick MF, Binkley , Bischoff-Ferrari HA, et al.    Evaluation, treatment, and prevention of vitamin D    deficiency: an Endocrine Society clinical practice    guideline. JCEM. 2011 Jul; 96(7):1911-30.   CBC With Differential     Status: Abnormal   Collection Time: 12/11/22 10:28 AM  Result Value Ref Range   WBC 6.0 3.4 - 10.8 x10E3/uL   RBC 5.77 (H) 3.77 - 5.28 x10E6/uL   Hemoglobin 16.1 (H) 11.1 - 15.9 g/dL   Hematocrit 86.5 (H) 78.4 - 46.6 %   MCV 86 79 - 97 fL   MCH 27.9 26.6 - 33.0 pg   MCHC 32.5 31.5 - 35.7 g/dL   RDW 69.6 29.5 - 28.4 %   Neutrophils 47 Not Estab. %   Lymphs 40 Not Estab. %   Monocytes 7 Not Estab. %   Eos 5 Not Estab. %   Basos 1 Not Estab. %   Neutrophils Absolute 2.8 1.4 - 7.0 x10E3/uL   Lymphocytes Absolute 2.4 0.7 - 3.1 x10E3/uL   Monocytes Absolute 0.4 0.1 - 0.9 x10E3/uL   EOS (ABSOLUTE) 0.3 0.0 - 0.4 x10E3/uL   Basophils Absolute 0.1 0.0 - 0.2 x10E3/uL   Immature Granulocytes 0 Not Estab. %   Immature Grans (Abs) 0.0 0.0 - 0.1 x10E3/uL  CMP14+EGFR     Status: None   Collection Time: 12/11/22 10:28 AM  Result Value Ref Range   Glucose 99 70 - 99 mg/dL   BUN 10 6 - 24 mg/dL   Creatinine, Ser 1.32 0.57 - 1.00 mg/dL   eGFR 81 >44 WN/UUV/2.53   BUN/Creatinine Ratio 12 9 - 23   Sodium 140 134 - 144 mmol/L   Potassium 4.3 3.5 - 5.2 mmol/L   Chloride 103 96 - 106 mmol/L   CO2 23 20 - 29 mmol/L   Calcium 9.6 8.7 - 10.2 mg/dL   Total Protein 7.3 6.0 - 8.5 g/dL   Albumin 4.2 3.8 - 4.9 g/dL   Globulin, Total 3.1 1.5 - 4.5 g/dL   Albumin/Globulin Ratio 1.4 1.2 - 2.2   Bilirubin Total  0.7 0.0 - 1.2 mg/dL   Alkaline Phosphatase 88 44 - 121 IU/L   AST 15 0 - 40 IU/L   ALT 16 0 - 32 IU/L  TSH     Status: None   Collection Time: 12/11/22 10:28 AM  Result Value Ref Range   TSH 0.627 0.450 - 4.500 uIU/mL  Hemoglobin A1c     Status: Abnormal   Collection Time: 12/11/22 10:28 AM  Result Value Ref Range   Hgb A1c MFr Bld 6.0 (H) 4.8 - 5.6 %    Comment:          Prediabetes: 5.7 - 6.4          Diabetes: >6.4          Glycemic control for adults with diabetes: <7.0    Est. average glucose Bld gHb Est-mCnc 126 mg/dL  Vitamin G64  Status: None   Collection Time: 12/11/22 10:28 AM  Result Value Ref Range   Vitamin B-12 277 232 - 1,245 pg/mL       Assessment & Plan:   Problem List Items Addressed This Visit       Active Problems   Type 2 diabetes mellitus with hyperglycemia (HCC) - Primary    Checking labs today. Will call pt. With results  Continue current diabetes POC, as patient has been well controlled on current regimen.  Will adjust meds if needed based on labs.       Relevant Medications   rosuvastatin (CRESTOR) 40 MG tablet   Semaglutide (RYBELSUS) 14 MG TABS   Other Relevant Orders   POCT CBG (Fasting - Glucose) (Completed)   CBC With Differential (Completed)   CMP14+EGFR (Completed)   POC CREATINE & ALBUMIN,URINE (Completed)   Type 2 diabetes mellitus with other circulatory complications (HCC)    Blood pressure well controlled with current medications.  Continue current therapy.  Will reassess at follow up.       Relevant Medications   rosuvastatin (CRESTOR) 40 MG tablet   amLODipine (NORVASC) 10 MG tablet   Semaglutide (RYBELSUS) 14 MG TABS   icosapent Ethyl (VASCEPA) 1 g capsule   Other Relevant Orders   CBC With Differential (Completed)   CMP14+EGFR (Completed)   Hemoglobin A1c (Completed)   Chronic obstructive pulmonary disease with (acute) exacerbation (HCC)    Patient stable.  Well controlled with current therapy.   Continue  current meds.        Relevant Medications   Fluticasone-Umeclidin-Vilant (TRELEGY ELLIPTA) 100-62.5-25 MCG/ACT AEPB   albuterol (VENTOLIN HFA) 108 (90 Base) MCG/ACT inhaler   Other Relevant Orders   CBC With Differential (Completed)   CMP14+EGFR (Completed)   Hyperlipidemia associated with type 2 diabetes mellitus (HCC)    Checking labs today.  Continue current therapy for lipid control. Will modify as needed based on labwork results.        Relevant Medications   rosuvastatin (CRESTOR) 40 MG tablet   amLODipine (NORVASC) 10 MG tablet   Semaglutide (RYBELSUS) 14 MG TABS   icosapent Ethyl (VASCEPA) 1 g capsule   Other Relevant Orders   Lipid panel (Completed)   CBC With Differential (Completed)   CMP14+EGFR (Completed)   Vitamin D deficiency, unspecified    Checking labs today.  Will continue supplements as needed.        Relevant Orders   VITAMIN D 25 Hydroxy (Vit-D Deficiency, Fractures) (Completed)   CBC With Differential (Completed)   CMP14+EGFR (Completed)   Other Visit Diagnoses     B12 deficiency due to diet       Relevant Orders   CBC With Differential (Completed)   CMP14+EGFR (Completed)   Vitamin B12 (Completed)   Hypothyroidism (acquired)       Relevant Orders   CBC With Differential (Completed)   CMP14+EGFR (Completed)   TSH (Completed)      Return in about 3 months (around 03/13/2023) for F/U.   Total time spent: 30 minutes  Miki Kins, FNP  12/11/2022   This document may have been prepared by [facility] Voice Recognition software and as such may include unintentional dictation errors.

## 2022-12-12 NOTE — Assessment & Plan Note (Signed)
Patient stable.  Well controlled with current therapy.   Continue current meds.

## 2022-12-12 NOTE — Assessment & Plan Note (Signed)
Blood pressure well controlled with current medications.  Continue current therapy.  Will reassess at follow up.  

## 2022-12-12 NOTE — Assessment & Plan Note (Signed)
Checking labs today.  Continue current therapy for lipid control. Will modify as needed based on labwork results.  

## 2022-12-12 NOTE — Assessment & Plan Note (Signed)
Checking labs today. Will call pt. With results  Continue current diabetes POC, as patient has been well controlled on current regimen.  Will adjust meds if needed based on labs.  

## 2023-01-18 ENCOUNTER — Other Ambulatory Visit: Payer: Self-pay | Admitting: Family

## 2023-01-28 ENCOUNTER — Telehealth: Payer: Self-pay

## 2023-01-28 NOTE — Progress Notes (Signed)
Spoke with pt who verbalized understanding.

## 2023-01-31 NOTE — Telephone Encounter (Signed)
Will send via parachute

## 2023-02-25 DIAGNOSIS — J449 Chronic obstructive pulmonary disease, unspecified: Secondary | ICD-10-CM | POA: Diagnosis not present

## 2023-03-01 DIAGNOSIS — R6889 Other general symptoms and signs: Secondary | ICD-10-CM | POA: Diagnosis not present

## 2023-03-13 ENCOUNTER — Encounter: Payer: Self-pay | Admitting: Family

## 2023-03-13 ENCOUNTER — Ambulatory Visit (INDEPENDENT_AMBULATORY_CARE_PROVIDER_SITE_OTHER): Payer: Medicare HMO | Admitting: Family

## 2023-03-13 VITALS — BP 156/86 | HR 69 | Ht 67.0 in | Wt 249.2 lb

## 2023-03-13 DIAGNOSIS — R6889 Other general symptoms and signs: Secondary | ICD-10-CM | POA: Diagnosis not present

## 2023-03-13 DIAGNOSIS — E559 Vitamin D deficiency, unspecified: Secondary | ICD-10-CM | POA: Diagnosis not present

## 2023-03-13 DIAGNOSIS — J441 Chronic obstructive pulmonary disease with (acute) exacerbation: Secondary | ICD-10-CM | POA: Diagnosis not present

## 2023-03-13 DIAGNOSIS — G4733 Obstructive sleep apnea (adult) (pediatric): Secondary | ICD-10-CM

## 2023-03-13 DIAGNOSIS — E1159 Type 2 diabetes mellitus with other circulatory complications: Secondary | ICD-10-CM | POA: Diagnosis not present

## 2023-03-13 DIAGNOSIS — E785 Hyperlipidemia, unspecified: Secondary | ICD-10-CM | POA: Diagnosis not present

## 2023-03-13 DIAGNOSIS — E1165 Type 2 diabetes mellitus with hyperglycemia: Secondary | ICD-10-CM | POA: Diagnosis not present

## 2023-03-13 DIAGNOSIS — E1169 Type 2 diabetes mellitus with other specified complication: Secondary | ICD-10-CM | POA: Diagnosis not present

## 2023-03-13 DIAGNOSIS — E782 Mixed hyperlipidemia: Secondary | ICD-10-CM | POA: Diagnosis not present

## 2023-03-13 DIAGNOSIS — E538 Deficiency of other specified B group vitamins: Secondary | ICD-10-CM

## 2023-03-13 DIAGNOSIS — Z23 Encounter for immunization: Secondary | ICD-10-CM | POA: Diagnosis not present

## 2023-03-13 LAB — POCT CBG (FASTING - GLUCOSE)-MANUAL ENTRY: Glucose Fasting, POC: 110 mg/dL — AB (ref 70–99)

## 2023-03-13 MED ORDER — AREXVY 120 MCG/0.5ML IM SUSR: 0.5000 mL | Freq: Once | INTRAMUSCULAR | 0 refills | Status: AC

## 2023-03-13 MED ORDER — VIBERZI 75 MG PO TABS: 75.0000 mg | ORAL_TABLET | Freq: Two times a day (BID) | ORAL | 3 refills | Status: DC

## 2023-03-13 MED ORDER — ZOSTER VAC RECOMB ADJUVANTED 50 MCG/0.5ML IM SUSR: 0.5000 mL | Freq: Once | INTRAMUSCULAR | 1 refills | Status: AC

## 2023-03-13 MED ORDER — MOUNJARO 5 MG/0.5ML ~~LOC~~ SOAJ: 5.0000 mg | SUBCUTANEOUS | 3 refills | Status: DC

## 2023-03-13 NOTE — Assessment & Plan Note (Signed)
Checking labs today.  Will continue supplements as needed.  

## 2023-03-13 NOTE — Assessment & Plan Note (Signed)
Checking labs today.  Continue current therapy for lipid control. Will modify as needed based on labwork results.  

## 2023-03-13 NOTE — Progress Notes (Signed)
Established Patient Office Visit  Subjective:  Patient ID: Tracy Hood, female    DOB: Feb 02, 1961  Age: 62 y.o. MRN: [id number]  Chief Complaint  Patient presents with   Follow-up    3 month follow up    Patient is here today for her 3 months follow up.  She has been feeling fairly well since last appointment.   She does not have additional concerns to discuss today.  Labs are due today. She needs refills.   I have reviewed her active problem list, medication list, allergies, notes from last encounter, lab results for her appointment today.    No other concerns at this time.   Past Medical History:  Diagnosis Date   Acute hypoxemic respiratory failure (HCC) 08/17/2019   Acute respiratory failure with hypoxia (HCC) 08/16/2019   Anxiety    Appendicitis with peritoneal abscess 01/28/2018   Arthritis    COPD (chronic obstructive pulmonary disease) (HCC)    Diabetes mellitus without complication (HCC)    type 2   Dyspnea    Hypertension    IBS (irritable bowel syndrome)    Obesity    Sleep apnea    STARTED USING CPAP 3 MONTHS AGO    Past Surgical History:  Procedure Laterality Date   COLONOSCOPY WITH PROPOFOL N/A 12/05/2018   Procedure: COLONOSCOPY WITH PROPOFOL;  Surgeon: Christena Deem, MD;  Location: [facility] ENDOSCOPY;  Service: Endoscopy;  Laterality: N/A;   IR RADIOLOGIST EVAL & MGMT  02/05/2018   JOINT REPLACEMENT Left 2018   tkr   LAPAROSCOPIC APPENDECTOMY N/A 01/06/2019   Procedure: APPENDECTOMY LAPAROSCOPIC, DIABETIC;  Surgeon: Sung Amabile, DO;  Location: [facility] ORS;  Service: General;  Laterality: N/A;   TUBAL LIGATION  1990    Social History   Socioeconomic History   Marital status: Single    Spouse name: Not on file   Number of children: 5   Years of education: Not on file   Highest education level: Not on file  Occupational History   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    Comment: disability  Tobacco Use   Smoking status: Former    Current  packs/day: 0.00    Types: Cigarettes    Quit date: 2019    Years since quitting: 5.6   Smokeless tobacco: Never  Vaping Use   Vaping status: Never Used  Substance and Sexual Activity   Alcohol use: Yes    Alcohol/week: 4.0 standard drinks of alcohol    Types: 4 Glasses of wine per week   Drug use: Never   Sexual activity: Not Currently    Birth control/protection: None  Other Topics Concern   Not on file  Social History Narrative   Not on file   Social Determinants of Health   Financial Resource Strain: Not on file  Food Insecurity: Not on file  Transportation Needs: Not on file  Physical Activity: Not on file  Stress: Not on file  Social Connections: Not on file  Intimate Partner Violence: Not on file    Family History  Problem Relation Age of Onset   Colon cancer Sister     Allergies  Allergen Reactions   Ace Inhibitors     swelling of lip/face and closing of throat   Sulfa Antibiotics Itching    Review of Systems  Gastrointestinal:  Positive for diarrhea.  All other systems reviewed and are negative.      Objective:   BP (!) 156/86   Pulse 69   Ht 5\' 7"  (  1.702 m)   Wt 249 lb 3.2 oz (113 kg)   SpO2 91%   BMI 39.03 kg/m   Vitals:   03/13/23 0839  BP: (!) 156/86  Pulse: 69  Height: 5\' 7"  (1.702 m)  Weight: 249 lb 3.2 oz (113 kg)  SpO2: 91%  BMI (Calculated): 39.02    Physical Exam Vitals and nursing note reviewed.  Constitutional:      Appearance: Normal appearance. She is normal weight.  HENT:     Head: Normocephalic.  Eyes:     Pupils: Pupils are equal, round, and reactive to light.  Cardiovascular:     Rate and Rhythm: Normal rate.  Pulmonary:     Effort: Pulmonary effort is normal.  Neurological:     General: No focal deficit present.     Mental Status: She is alert and oriented to person, place, and time. Mental status is at baseline.  Psychiatric:        Mood and Affect: Mood normal.        Behavior: Behavior normal.         Thought Content: Thought content normal.        Judgment: Judgment normal.      Results for orders placed or performed in visit on 03/13/23  POCT CBG (Fasting - Glucose)  Result Value Ref Range   Glucose Fasting, POC 110 (A) 70 - 99 mg/dL    Recent Results (from the past 2160 hour(s))  POCT CBG (Fasting - Glucose)     Status: Abnormal   Collection Time: 03/13/23  8:45 AM  Result Value Ref Range   Glucose Fasting, POC 110 (A) 70 - 99 mg/dL       Assessment & Plan:   Problem List Items Addressed This Visit       Active Problems   Type 2 diabetes mellitus with hyperglycemia (HCC) - Primary    Checking labs today. Will call pt. With results  Continue current diabetes POC, as patient has been well controlled on current regimen.  Will adjust meds if needed based on labs.       Relevant Medications   tirzepatide (MOUNJARO) 5 MG/0.5ML Pen   Other Relevant Orders   POCT CBG (Fasting - Glucose) (Completed)   CMP14+EGFR   TSH   Hemoglobin A1c   CBC with Differential/Platelet   Type 2 diabetes mellitus with other circulatory complications (HCC)   Relevant Medications   tirzepatide (MOUNJARO) 5 MG/0.5ML Pen   Other Relevant Orders   CMP14+EGFR   TSH   CBC with Differential/Platelet   Chronic obstructive pulmonary disease with (acute) exacerbation (HCC)    Patient stable.  Well controlled with current therapy.   Continue current meds.       Relevant Orders   CMP14+EGFR   TSH   CBC with Differential/Platelet   Hyperlipidemia associated with type 2 diabetes mellitus (HCC)    Checking labs today.  Continue current therapy for lipid control. Will modify as needed based on labwork results.       Relevant Medications   tirzepatide (MOUNJARO) 5 MG/0.5ML Pen   Other Relevant Orders   Lipid panel   Vitamin D deficiency, unspecified    Checking labs today.  Will continue supplements as needed.        Relevant Orders   VITAMIN D 25 Hydroxy (Vit-D Deficiency,  Fractures)   CMP14+EGFR   TSH   CBC with Differential/Platelet   OSA (obstructive sleep apnea)    Patient is compliant with CPAP and  is benefiting from therapy.  Continue use.   Reassess at follow up.       Other Visit Diagnoses     Need for shingles vaccine       Relevant Orders   CMP14+EGFR   TSH   CBC with Differential/Platelet   B12 deficiency due to diet       Checking labs today.  Will continue supplements as needed.   Relevant Orders   CMP14+EGFR   TSH   Vitamin B12   CBC with Differential/Platelet       Return in about 3 months (around 06/12/2023) for AWV.   Total time spent: 20 minutes  Miki Kins, FNP  03/13/2023   This document may have been prepared by [facility] Voice Recognition software and as such may include unintentional dictation errors.

## 2023-03-13 NOTE — Assessment & Plan Note (Signed)
Checking labs today. Will call pt. With results  Continue current diabetes POC, as patient has been well controlled on current regimen.  Will adjust meds if needed based on labs.  

## 2023-03-13 NOTE — Assessment & Plan Note (Signed)
Patient stable.  Well controlled with current therapy.   Continue current meds.  

## 2023-03-13 NOTE — Assessment & Plan Note (Signed)
Patient is compliant with CPAP and is benefiting from therapy.  Continue use.   Reassess at follow up.

## 2023-03-14 LAB — HEMOGLOBIN A1C
Est. average glucose Bld gHb Est-mCnc: 128 mg/dL
Hgb A1c MFr Bld: 6.1 % — ABNORMAL HIGH (ref 4.8–5.6)

## 2023-03-14 LAB — CMP14+EGFR
ALT: 11 IU/L (ref 0–32)
AST: 12 IU/L (ref 0–40)
Albumin: 4.1 g/dL (ref 3.8–4.9)
Alkaline Phosphatase: 77 IU/L (ref 44–121)
BUN/Creatinine Ratio: 14 (ref 9–23)
BUN: 9 mg/dL (ref 6–24)
Bilirubin Total: 0.9 mg/dL (ref 0.0–1.2)
CO2: 23 mmol/L (ref 20–29)
Calcium: 9.5 mg/dL (ref 8.7–10.2)
Chloride: 105 mmol/L (ref 96–106)
Creatinine, Ser: 0.65 mg/dL (ref 0.57–1.00)
Globulin, Total: 2.8 g/dL (ref 1.5–4.5)
Glucose: 101 mg/dL — ABNORMAL HIGH (ref 70–99)
Potassium: 4.3 mmol/L (ref 3.5–5.2)
Sodium: 141 mmol/L (ref 134–144)
Total Protein: 6.9 g/dL (ref 6.0–8.5)
eGFR: 101 mL/min/{1.73_m2} (ref >59)

## 2023-03-14 LAB — LIPID PANEL
Chol/HDL Ratio: 4.7 ratio — ABNORMAL HIGH (ref 0.0–4.4)
Cholesterol, Total: 257 mg/dL — ABNORMAL HIGH (ref 100–199)
HDL: 55 mg/dL (ref >39)
LDL Chol Calc (NIH): 170 mg/dL — ABNORMAL HIGH (ref 0–99)
Triglycerides: 176 mg/dL — ABNORMAL HIGH (ref 0–149)
VLDL Cholesterol Cal: 32 mg/dL (ref 5–40)

## 2023-03-14 LAB — VITAMIN D 25 HYDROXY (VIT D DEFICIENCY, FRACTURES): Vit D, 25-Hydroxy: 30.6 ng/mL (ref 30.0–100.0)

## 2023-03-14 LAB — VITAMIN B12: Vitamin B-12: 275 pg/mL (ref 232–1245)

## 2023-03-14 LAB — TSH: TSH: 0.893 u[IU]/mL (ref 0.450–4.500)

## 2023-03-15 DIAGNOSIS — K580 Irritable bowel syndrome with diarrhea: Secondary | ICD-10-CM | POA: Insufficient documentation

## 2023-04-04 DIAGNOSIS — J449 Chronic obstructive pulmonary disease, unspecified: Secondary | ICD-10-CM | POA: Diagnosis not present

## 2023-04-11 DIAGNOSIS — R6889 Other general symptoms and signs: Secondary | ICD-10-CM | POA: Diagnosis not present

## 2023-06-18 ENCOUNTER — Ambulatory Visit: Payer: Medicare HMO | Admitting: Family

## 2023-06-28 ENCOUNTER — Ambulatory Visit: Payer: Medicare HMO | Admitting: Family

## 2023-06-28 ENCOUNTER — Encounter: Payer: Self-pay | Admitting: Family

## 2023-06-28 VITALS — BP 118/73 | HR 72 | Ht 67.0 in | Wt 240.3 lb

## 2023-06-28 DIAGNOSIS — E1165 Type 2 diabetes mellitus with hyperglycemia: Secondary | ICD-10-CM

## 2023-06-28 DIAGNOSIS — E559 Vitamin D deficiency, unspecified: Secondary | ICD-10-CM | POA: Diagnosis not present

## 2023-06-28 DIAGNOSIS — I152 Hypertension secondary to endocrine disorders: Secondary | ICD-10-CM

## 2023-06-28 DIAGNOSIS — E1169 Type 2 diabetes mellitus with other specified complication: Secondary | ICD-10-CM | POA: Diagnosis not present

## 2023-06-28 DIAGNOSIS — R6889 Other general symptoms and signs: Secondary | ICD-10-CM | POA: Diagnosis not present

## 2023-06-28 DIAGNOSIS — E538 Deficiency of other specified B group vitamins: Secondary | ICD-10-CM

## 2023-06-28 DIAGNOSIS — Z1231 Encounter for screening mammogram for malignant neoplasm of breast: Secondary | ICD-10-CM

## 2023-06-28 DIAGNOSIS — E785 Hyperlipidemia, unspecified: Secondary | ICD-10-CM

## 2023-06-28 DIAGNOSIS — E039 Hypothyroidism, unspecified: Secondary | ICD-10-CM

## 2023-06-28 DIAGNOSIS — Z114 Encounter for screening for human immunodeficiency virus [HIV]: Secondary | ICD-10-CM

## 2023-06-28 DIAGNOSIS — Z23 Encounter for immunization: Secondary | ICD-10-CM | POA: Diagnosis not present

## 2023-06-28 DIAGNOSIS — E1159 Type 2 diabetes mellitus with other circulatory complications: Secondary | ICD-10-CM

## 2023-06-28 DIAGNOSIS — Z1159 Encounter for screening for other viral diseases: Secondary | ICD-10-CM | POA: Diagnosis not present

## 2023-06-28 DIAGNOSIS — Z0130 Encounter for examination of blood pressure without abnormal findings: Secondary | ICD-10-CM

## 2023-06-28 LAB — POCT CBG (FASTING - GLUCOSE)-MANUAL ENTRY: Glucose Fasting, POC: 100 mg/dL — AB (ref 70–99)

## 2023-06-28 MED ORDER — AZITHROMYCIN 250 MG PO TABS: ORAL_TABLET | ORAL | 0 refills | Status: AC

## 2023-06-28 NOTE — Progress Notes (Unsigned)
Annual Wellness Visit  Patient: Tracy Hood, Female    DOB: 09/02/1960, 62 y.o.   MRN: [id number] Visit Date: 06/28/2023  Today's Provider: Miki Kins, FNP  Subjective:   Chief Complaint  Patient presents with  . Follow-up    Sinus issues  . Sinus Problem    Sinus issues OTC medication has been tried but not seem to help fully  Wondering if she can get an RX   Tracy Hood is a 62 y.o. female who presents today for her Annual Wellness Visit.  HPI  Past Medical History:  Diagnosis Date  . Acute hypoxemic respiratory failure (HCC) 08/17/2019  . Acute respiratory failure with hypoxia (HCC) 08/16/2019  . Anxiety   . Appendicitis with peritoneal abscess 01/28/2018  . Arthritis   . COPD (chronic obstructive pulmonary disease) (HCC)   . Diabetes mellitus without complication (HCC)    type 2  . Dyspnea   . Hypertension   . IBS (irritable bowel syndrome)   . Obesity   . Sleep apnea    STARTED USING CPAP 3 MONTHS AGO   Past Surgical History:  Procedure Laterality Date  . COLONOSCOPY WITH PROPOFOL N/A 12/05/2018   Procedure: COLONOSCOPY WITH PROPOFOL;  Surgeon: Christena Deem, MD;  Location: [facility] ENDOSCOPY;  Service: Endoscopy;  Laterality: N/A;  . IR RADIOLOGIST EVAL & MGMT  02/05/2018  . JOINT REPLACEMENT Left 2018   tkr  . LAPAROSCOPIC APPENDECTOMY N/A 01/06/2019   Procedure: APPENDECTOMY LAPAROSCOPIC, DIABETIC;  Surgeon: Sung Amabile, DO;  Location: [facility] ORS;  Service: General;  Laterality: N/A;  . TUBAL LIGATION  1990   Family History  Problem Relation Age of Onset  . Colon cancer Sister    Social History   Socioeconomic History  . Marital status: Single    Spouse name: Not on file  . Number of children: 5  . Years of education: Not on file  . Highest education level: Not on file  Occupational History  . 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 Smoking status: Former    Current packs/day: 0.00    Types:  Cigarettes    Quit date: 2019    Years since quitting: 5.9  . Smokeless tobacco: Never  Vaping Use  . Vaping status: Never Used  Substance and Sexual Activity  . Alcohol use: Yes    Alcohol/week: 4.0 standard drinks of alcohol    Types: 4 Glasses of wine per week  . Drug use: Never  . Sexual activity: Not Currently    Birth control/protection: None  Other Topics Concern  . Not on file  Social History Narrative  . Not on file   Social Drivers of Health   Financial Resource Strain: Not on file  Food Insecurity: Not on file  Transportation Needs: Not on file  Physical Activity: Not on file  Stress: Not on file  Social Connections: Not on file  Intimate Partner Violence: Not on file    Medications: Outpatient Medications Prior to Visit  Medication Sig  . albuterol (VENTOLIN HFA) 108 (90 Base) MCG/ACT inhaler Inhale 2 puffs into the lungs every 6 (six) hours as needed for wheezing or shortness of breath.  Marland Kitchen amitriptyline (ELAVIL) 25 MG tablet TAKE 2 TABLETS (50 MG TOTAL) BY MOUTH AT BEDTIME AS NEEDED FOR SLEEP.  Marland Kitchen amLODipine (NORVASC) 10 MG tablet Take 1 tablet (10 mg total) by mouth daily.  . cyclobenzaprine (FLEXERIL) 5 MG tablet Take 5 mg by mouth  2 (two) times daily as needed.  . Eluxadoline (VIBERZI) 75 MG TABS Take 1 tablet (75 mg total) by mouth 2 (two) times daily.  . Fluticasone-Umeclidin-Vilant (TRELEGY ELLIPTA) 100-62.5-25 MCG/ACT AEPB Trelegy Ellipta 100-62.5-25 MCG/INH Inhalation Aerosol Powder Breath Activated QTY: 60 each Days: 30 Refills: 0  Written: 11/05/19 Patient Instructions: inhale 1 puff once daily. Rince mouth well after use.  . icosapent Ethyl (VASCEPA) 1 g capsule Take 2 capsules (2 g total) by mouth 2 (two) times daily.  . Multiple Vitamins-Minerals (MULTIVITAMIN WOMEN 50+ PO) Take 1 tablet by mouth daily.  . rosuvastatin (CRESTOR) 40 MG tablet Take 1 tablet (40 mg total) by mouth daily.  . Semaglutide (RYBELSUS) 14 MG TABS Take 1 tablet (14 mg total) by  mouth every morning.  . tirzepatide (MOUNJARO) 5 MG/0.5ML Pen Inject 5 mg into the skin once a week.  . Zoster Vaccine Adjuvanted [facility]) injection Inject 0.5 mLs into the muscle once for 1 dose. Repeat in 6 months  . [DISCONTINUED] pantoprazole (PROTONIX) 20 MG tablet Take 1 tablet (20 mg total) by mouth daily for 10 days. (Patient not taking: Reported on 03/13/2023)   No facility-administered medications prior to visit.    Allergies  Allergen Reactions  . Ace Inhibitors     swelling of lip/face and closing of throat  . Sulfa Antibiotics Itching    Patient Care Team: Miki Kins, FNP as PCP - General (Family Medicine)  Review of Systems  {Show previous labs (optional):23779::" "}   Objective:    Vitals: BP 118/73   Pulse 72   Ht 5\' 7"  (1.702 m)   Wt 240 lb 4.8 oz (109 kg)   SpO2 94%   BMI 37.64 kg/m  {Show previous vitals signs (optional):23777::" "}  Physical Exam ***  Most recent functional status assessment:     No data to display          Most recent fall risk assessment:     No data to display           Most recent depression screenings:     No data to display          Most recent cognitive screening:     No data to display          Results for orders placed or performed in visit on 06/28/23  POCT CBG (Fasting - Glucose)  Result Value Ref Range   Glucose Fasting, POC 100 (A) 70 - 99 mg/dL       Assessment & Plan:      Annual wellness visit done today including the all of the following: Reviewed patient's Family Medical History Reviewed and updated list of patient's medical providers Assessment of cognitive impairment was done Assessed patient's functional ability Established a written schedule for health screening services Health Risk Assessent Completed and Reviewed  Exercise Activities and Dietary recommendations  Goals   None     Immunization History  Administered Date(s) Administered  . Pfizer(Comirnaty)Fall  Seasonal Vaccine 12 years and older 03/18/2023  . Zoster Recombinant(Shingrix) 03/18/2023    Health Maintenance  Topic Date Due  . OPHTHALMOLOGY EXAM  Never done  . Hepatitis C Screening  Never done  . DTaP/Tdap/Td (1 - Tdap) Never done  . Cervical Cancer Screening (HPV/Pap Cotest)  Never done  . INFLUENZA VACCINE  02/07/2023  . Medicare Annual Wellness (AWV)  04/13/2023  . FOOT EXAM  05/02/2023  . Zoster Vaccines- Shingrix (2 of 2) 05/13/2023  . HEMOGLOBIN A1C  09/10/2023  . Diabetic kidney evaluation - Urine ACR  12/11/2023  . Diabetic kidney evaluation - eGFR measurement  03/12/2024  . MAMMOGRAM  05/21/2024  . Colonoscopy  12/04/2028  . COVID-19 Vaccine  Completed  . HIV Screening  Completed  . HPV VACCINES  Aged Out     Discussed health benefits of physical activity, and encouraged her to engage in regular exercise appropriate for her age and condition.    ***     Limuel Nieblas Daine Gravel, FNP   06/28/2023  This document may have been prepared by [facility] Voice Recognition software and as such may include unintentional dictation errors.

## 2023-07-06 LAB — CBC WITH DIFFERENTIAL/PLATELET
Basophils Absolute: 0.1 10*3/uL (ref 0.0–0.2)
Basos: 1 %
EOS (ABSOLUTE): 0.2 10*3/uL (ref 0.0–0.4)
Eos: 4 %
Hematocrit: 50.5 % — ABNORMAL HIGH (ref 34.0–46.6)
Hemoglobin: 16.2 g/dL — ABNORMAL HIGH (ref 11.1–15.9)
Immature Grans (Abs): 0 10*3/uL (ref 0.0–0.1)
Immature Granulocytes: 0 %
Lymphocytes Absolute: 2 10*3/uL (ref 0.7–3.1)
Lymphs: 34 %
MCH: 27.8 pg (ref 26.6–33.0)
MCHC: 32.1 g/dL (ref 31.5–35.7)
MCV: 87 fL (ref 79–97)
Monocytes Absolute: 0.4 10*3/uL (ref 0.1–0.9)
Monocytes: 7 %
Neutrophils Absolute: 3.1 10*3/uL (ref 1.4–7.0)
Neutrophils: 54 %
Platelets: 218 10*3/uL (ref 150–450)
RBC: 5.82 x10E6/uL — ABNORMAL HIGH (ref 3.77–5.28)
RDW: 13.5 % (ref 11.7–15.4)
WBC: 5.8 10*3/uL (ref 3.4–10.8)

## 2023-07-06 LAB — CMP14+EGFR
ALT: 15 [IU]/L (ref 0–32)
AST: 20 [IU]/L (ref 0–40)
Albumin: 4.2 g/dL (ref 3.8–4.9)
Alkaline Phosphatase: 87 [IU]/L (ref 44–121)
BUN/Creatinine Ratio: 13 (ref 12–28)
BUN: 9 mg/dL (ref 8–27)
Bilirubin Total: 0.4 mg/dL (ref 0.0–1.2)
Calcium: 9.2 mg/dL (ref 8.7–10.3)
Chloride: 105 mmol/L (ref 96–106)
Creatinine, Ser: 0.71 mg/dL (ref 0.57–1.00)
Globulin, Total: 3.2 g/dL (ref 1.5–4.5)
Glucose: 99 mg/dL (ref 70–99)
Potassium: 4.2 mmol/L (ref 3.5–5.2)
Sodium: 139 mmol/L (ref 134–144)
Total Protein: 7.4 g/dL (ref 6.0–8.5)
eGFR: 97 mL/min/{1.73_m2} (ref >59)

## 2023-07-06 LAB — TSH: TSH: 0.756 u[IU]/mL (ref 0.450–4.500)

## 2023-07-06 LAB — LIPID PANEL
Chol/HDL Ratio: 4.1 {ratio} (ref 0.0–4.4)
Cholesterol, Total: 223 mg/dL — ABNORMAL HIGH (ref 100–199)
HDL: 55 mg/dL (ref >39)
LDL Chol Calc (NIH): 142 mg/dL — ABNORMAL HIGH (ref 0–99)
Triglycerides: 148 mg/dL (ref 0–149)
VLDL Cholesterol Cal: 26 mg/dL (ref 5–40)

## 2023-07-06 LAB — VITAMIN B12: Vitamin B-12: 288 pg/mL (ref 232–1245)

## 2023-07-06 LAB — HEMOGLOBIN A1C
Est. average glucose Bld gHb Est-mCnc: 120 mg/dL
Hgb A1c MFr Bld: 5.8 % — ABNORMAL HIGH (ref 4.8–5.6)

## 2023-07-06 LAB — HCV AB W REFLEX TO QUANT PCR: HCV Ab: NONREACTIVE

## 2023-07-06 LAB — HIV ANTIBODY (ROUTINE TESTING W REFLEX): HIV Screen 4th Generation wRfx: NONREACTIVE

## 2023-07-06 LAB — HCV INTERPRETATION

## 2023-07-06 LAB — VITAMIN D 25 HYDROXY (VIT D DEFICIENCY, FRACTURES): Vit D, 25-Hydroxy: 42.8 ng/mL (ref 30.0–100.0)

## 2023-07-08 ENCOUNTER — Other Ambulatory Visit: Payer: Self-pay | Admitting: Family

## 2023-07-23 DIAGNOSIS — J449 Chronic obstructive pulmonary disease, unspecified: Secondary | ICD-10-CM | POA: Diagnosis not present

## 2023-08-13 DIAGNOSIS — J449 Chronic obstructive pulmonary disease, unspecified: Secondary | ICD-10-CM | POA: Diagnosis not present

## 2023-08-16 ENCOUNTER — Other Ambulatory Visit: Payer: Self-pay | Admitting: Family

## 2023-08-23 DIAGNOSIS — J449 Chronic obstructive pulmonary disease, unspecified: Secondary | ICD-10-CM | POA: Diagnosis not present

## 2023-09-01 DIAGNOSIS — G4733 Obstructive sleep apnea (adult) (pediatric): Secondary | ICD-10-CM | POA: Diagnosis not present

## 2023-09-20 DIAGNOSIS — J449 Chronic obstructive pulmonary disease, unspecified: Secondary | ICD-10-CM | POA: Diagnosis not present

## 2023-09-26 ENCOUNTER — Encounter: Payer: Self-pay | Admitting: Family

## 2023-09-26 ENCOUNTER — Ambulatory Visit (INDEPENDENT_AMBULATORY_CARE_PROVIDER_SITE_OTHER): Payer: Medicaid Other | Admitting: Family

## 2023-09-26 VITALS — BP 114/82 | HR 73 | Ht 67.0 in | Wt 237.6 lb

## 2023-09-26 DIAGNOSIS — Z0130 Encounter for examination of blood pressure without abnormal findings: Secondary | ICD-10-CM

## 2023-09-26 DIAGNOSIS — E039 Hypothyroidism, unspecified: Secondary | ICD-10-CM

## 2023-09-26 DIAGNOSIS — N760 Acute vaginitis: Secondary | ICD-10-CM

## 2023-09-26 DIAGNOSIS — Z124 Encounter for screening for malignant neoplasm of cervix: Secondary | ICD-10-CM

## 2023-09-26 DIAGNOSIS — Z202 Contact with and (suspected) exposure to infections with a predominantly sexual mode of transmission: Secondary | ICD-10-CM

## 2023-09-26 DIAGNOSIS — G4733 Obstructive sleep apnea (adult) (pediatric): Secondary | ICD-10-CM | POA: Diagnosis not present

## 2023-09-26 DIAGNOSIS — Z0001 Encounter for general adult medical examination with abnormal findings: Secondary | ICD-10-CM

## 2023-09-26 DIAGNOSIS — Z0000 Encounter for general adult medical examination without abnormal findings: Secondary | ICD-10-CM

## 2023-09-26 DIAGNOSIS — E1169 Type 2 diabetes mellitus with other specified complication: Secondary | ICD-10-CM | POA: Diagnosis not present

## 2023-09-26 DIAGNOSIS — E785 Hyperlipidemia, unspecified: Secondary | ICD-10-CM

## 2023-09-26 DIAGNOSIS — E1165 Type 2 diabetes mellitus with hyperglycemia: Secondary | ICD-10-CM

## 2023-09-26 DIAGNOSIS — R87619 Unspecified abnormal cytological findings in specimens from cervix uteri: Secondary | ICD-10-CM

## 2023-09-26 DIAGNOSIS — E538 Deficiency of other specified B group vitamins: Secondary | ICD-10-CM

## 2023-09-26 DIAGNOSIS — Z1151 Encounter for screening for human papillomavirus (HPV): Secondary | ICD-10-CM

## 2023-09-26 DIAGNOSIS — E559 Vitamin D deficiency, unspecified: Secondary | ICD-10-CM | POA: Diagnosis not present

## 2023-09-26 DIAGNOSIS — Z1272 Encounter for screening for malignant neoplasm of vagina: Secondary | ICD-10-CM

## 2023-09-29 DIAGNOSIS — G4733 Obstructive sleep apnea (adult) (pediatric): Secondary | ICD-10-CM | POA: Diagnosis not present

## 2023-10-03 DIAGNOSIS — G4733 Obstructive sleep apnea (adult) (pediatric): Secondary | ICD-10-CM | POA: Diagnosis not present

## 2023-10-03 LAB — CBC WITH DIFFERENTIAL/PLATELET
Basophils Absolute: 0.1 10*3/uL (ref 0.0–0.2)
Basos: 2 %
EOS (ABSOLUTE): 0.3 10*3/uL (ref 0.0–0.4)
Eos: 5 %
Hematocrit: 50.5 % — ABNORMAL HIGH (ref 34.0–46.6)
Hemoglobin: 16.4 g/dL — ABNORMAL HIGH (ref 11.1–15.9)
Immature Grans (Abs): 0 10*3/uL (ref 0.0–0.1)
Immature Granulocytes: 0 %
Lymphocytes Absolute: 2.1 10*3/uL (ref 0.7–3.1)
Lymphs: 36 %
MCH: 27.6 pg (ref 26.6–33.0)
MCHC: 32.5 g/dL (ref 31.5–35.7)
MCV: 85 fL (ref 79–97)
Monocytes Absolute: 0.4 10*3/uL (ref 0.1–0.9)
Monocytes: 6 %
Neutrophils Absolute: 3 10*3/uL (ref 1.4–7.0)
Neutrophils: 51 %
Platelets: 224 10*3/uL (ref 150–450)
RBC: 5.94 x10E6/uL — ABNORMAL HIGH (ref 3.77–5.28)
RDW: 13.6 % (ref 11.7–15.4)
WBC: 5.9 10*3/uL (ref 3.4–10.8)

## 2023-10-03 LAB — CMP14+EGFR

## 2023-10-03 LAB — VITAMIN B12: Vitamin B-12: 359 pg/mL (ref 232–1245)

## 2023-10-03 LAB — LIPID PANEL

## 2023-10-03 LAB — HEMOGLOBIN A1C
Est. average glucose Bld gHb Est-mCnc: 114 mg/dL
Hgb A1c MFr Bld: 5.6 % (ref 4.8–5.6)

## 2023-10-03 LAB — VITAMIN D 25 HYDROXY (VIT D DEFICIENCY, FRACTURES): Vit D, 25-Hydroxy: 31.6 ng/mL (ref 30.0–100.0)

## 2023-10-03 LAB — TSH

## 2023-10-04 LAB — IGP,CTNGTV,APT HPV,RFX16/18,45
Chlamydia, Nuc. Acid Amp: NEGATIVE
Gonococcus, Nuc. Acid Amp: NEGATIVE
HPV Aptima: NEGATIVE
PAP Smear Comment: 0
Trich vag by NAA: NEGATIVE

## 2023-10-21 DIAGNOSIS — J449 Chronic obstructive pulmonary disease, unspecified: Secondary | ICD-10-CM | POA: Diagnosis not present

## 2023-10-22 ENCOUNTER — Other Ambulatory Visit: Payer: Self-pay | Admitting: Family

## 2023-10-30 DIAGNOSIS — G4733 Obstructive sleep apnea (adult) (pediatric): Secondary | ICD-10-CM | POA: Diagnosis not present

## 2023-11-07 DIAGNOSIS — G4733 Obstructive sleep apnea (adult) (pediatric): Secondary | ICD-10-CM | POA: Diagnosis not present

## 2023-11-20 DIAGNOSIS — J449 Chronic obstructive pulmonary disease, unspecified: Secondary | ICD-10-CM | POA: Diagnosis not present

## 2023-11-29 DIAGNOSIS — G4733 Obstructive sleep apnea (adult) (pediatric): Secondary | ICD-10-CM | POA: Diagnosis not present

## 2023-12-11 ENCOUNTER — Encounter

## 2023-12-20 ENCOUNTER — Ambulatory Visit
Admission: RE | Admit: 2023-12-20 | Discharge: 2023-12-20 | Disposition: A | Discharge status: Home / Self Care | Payer: Self-pay | Source: Ambulatory Visit | Attending: Family | Admitting: Family

## 2023-12-20 DIAGNOSIS — Z1231 Encounter for screening mammogram for malignant neoplasm of breast: Secondary | ICD-10-CM | POA: Diagnosis not present

## 2023-12-21 DIAGNOSIS — J449 Chronic obstructive pulmonary disease, unspecified: Secondary | ICD-10-CM | POA: Diagnosis not present

## 2023-12-27 ENCOUNTER — Ambulatory Visit (INDEPENDENT_AMBULATORY_CARE_PROVIDER_SITE_OTHER): Admitting: Family

## 2023-12-27 ENCOUNTER — Encounter: Payer: Self-pay | Admitting: Family

## 2023-12-27 VITALS — BP 126/82 | HR 68 | Ht 67.0 in | Wt 235.6 lb

## 2023-12-27 DIAGNOSIS — E559 Vitamin D deficiency, unspecified: Secondary | ICD-10-CM | POA: Diagnosis not present

## 2023-12-27 DIAGNOSIS — E1159 Type 2 diabetes mellitus with other circulatory complications: Secondary | ICD-10-CM | POA: Diagnosis not present

## 2023-12-27 DIAGNOSIS — E039 Hypothyroidism, unspecified: Secondary | ICD-10-CM

## 2023-12-27 DIAGNOSIS — E538 Deficiency of other specified B group vitamins: Secondary | ICD-10-CM | POA: Diagnosis not present

## 2023-12-27 DIAGNOSIS — R0981 Nasal congestion: Secondary | ICD-10-CM | POA: Diagnosis not present

## 2023-12-27 DIAGNOSIS — R300 Dysuria: Secondary | ICD-10-CM | POA: Diagnosis not present

## 2023-12-27 DIAGNOSIS — E785 Hyperlipidemia, unspecified: Secondary | ICD-10-CM

## 2023-12-27 DIAGNOSIS — E1169 Type 2 diabetes mellitus with other specified complication: Secondary | ICD-10-CM | POA: Diagnosis not present

## 2023-12-27 DIAGNOSIS — I152 Hypertension secondary to endocrine disorders: Secondary | ICD-10-CM | POA: Diagnosis not present

## 2023-12-27 DIAGNOSIS — E1165 Type 2 diabetes mellitus with hyperglycemia: Secondary | ICD-10-CM | POA: Diagnosis not present

## 2023-12-27 LAB — POCT URINALYSIS DIPSTICK
Bilirubin, UA: NEGATIVE
Glucose, UA: NEGATIVE
Ketones, UA: NEGATIVE
Leukocytes, UA: NEGATIVE
Nitrite, UA: NEGATIVE
Protein, UA: POSITIVE — AB
Spec Grav, UA: 1.025 (ref 1.010–1.025)
Urobilinogen, UA: 0.2 U/dL
pH, UA: 6 (ref 5.0–8.0)

## 2023-12-27 LAB — POC CREATINE & ALBUMIN,URINE
Creatinine, POC: 200 mg/dL
Microalbumin Ur, POC: 150 mg/L

## 2023-12-27 MED ORDER — FLUTICASONE PROPIONATE 50 MCG/ACT NA SUSP: 1.0000 | Freq: Two times a day (BID) | NASAL | 2 refills | Status: DC

## 2023-12-27 NOTE — Assessment & Plan Note (Signed)
 Checking labs today. Will call pt. With results  Continue current diabetes POC, as patient has been well controlled on current regimen.  Will adjust meds if needed based on labs.

## 2023-12-27 NOTE — Assessment & Plan Note (Signed)
 Checking labs today.  Will continue supplements as needed.

## 2023-12-27 NOTE — Progress Notes (Signed)
 Established Patient Office Visit  Subjective:  Patient ID: Tracy Hood, female    DOB: December 16, 1960  Age: 63 y.o. MRN: [id number]  Chief Complaint  Patient presents with   Follow-up    3 month follow up   Patient is here today for her 3 months follow up.  She has been feeling fairly well since last appointment.   She does have additional concerns to discuss today.  Labs are due today. She needs refills.   I have reviewed her active problem list, medication list, allergies, health maintenance, notes from last encounter, lab results for her appointment today.    No other concerns at this time.   Past Medical History:  Diagnosis Date   Acute hypoxemic respiratory failure (HCC) 08/17/2019   Acute respiratory failure with hypoxia (HCC) 08/16/2019   Anxiety    Appendicitis with peritoneal abscess 01/28/2018   Arthritis    COPD (chronic obstructive pulmonary disease) (HCC)    Diabetes mellitus without complication (HCC)    type 2   Dyspnea    Hypertension    IBS (irritable bowel syndrome)    Obesity    Sleep apnea    STARTED USING CPAP 3 MONTHS AGO    Past Surgical History:  Procedure Laterality Date   COLONOSCOPY WITH PROPOFOL  N/A 12/05/2018   Procedure: COLONOSCOPY WITH PROPOFOL ;  Surgeon: Deveron Fly, MD;  Location: [facility] ENDOSCOPY;  Service: Endoscopy;  Laterality: N/A;   IR RADIOLOGIST EVAL & MGMT  02/05/2018   JOINT REPLACEMENT Left 2018   tkr   LAPAROSCOPIC APPENDECTOMY N/A 01/06/2019   Procedure: APPENDECTOMY LAPAROSCOPIC, DIABETIC;  Surgeon: Conrado Delay, DO;  Location: [facility] ORS;  Service: General;  Laterality: N/A;   TUBAL LIGATION  1990    Social History   Socioeconomic History   Marital status: Single    Spouse name: Not on file   Number of children: 5   Years of education: Not on file   Highest education level: Not on file  Occupational History   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    Comment: disability  Tobacco Use   Smoking status:  Former    Current packs/day: 0.00    Types: Cigarettes    Quit date: 2019    Years since quitting: 6.4   Smokeless tobacco: Never  Vaping Use   Vaping status: Never Used  Substance and Sexual Activity   Alcohol use: Yes    Alcohol/week: 4.0 standard drinks of alcohol    Types: 4 Glasses of wine per week   Drug use: Never   Sexual activity: Not Currently    Birth control/protection: None  Other Topics Concern   Not on file  Social History Narrative   Not on file   Social Drivers of Health   Financial Resource Strain: Low Risk  (06/28/2023)   Overall Financial Resource Strain (CARDIA)    Difficulty of Paying Living Expenses: Not hard at all  Food Insecurity: No Food Insecurity (06/28/2023)   Hunger Vital Sign    Worried About Running Out of Food in the Last Year: Never true    Ran Out of Food in the Last Year: Never true  Transportation Needs: No Transportation Needs (06/28/2023)   PRAPARE - Administrator, Civil Service (Medical): No    Lack of Transportation (Non-Medical): No  Physical Activity: Inactive (07/09/2023)   Exercise Vital Sign    Days of Exercise per Week: 0 days    Minutes of Exercise per Session: 0 min  Stress:  No Stress Concern Present (06/28/2023)   [employer] of Occupational Health - Occupational Stress Questionnaire    Feeling of Stress : Not at all  Social Connections: Moderately Isolated (06/28/2023)   Social Connection and Isolation Panel    Frequency of Communication with Friends and Family: More than three times a week    Frequency of Social Gatherings with Friends and Family: More than three times a week    Attends Religious Services: More than 4 times per year    Active Member of [employer] or Organizations: No    Attends Banker Meetings: Never    Marital Status: Divorced  Catering manager Violence: Not At Risk (06/28/2023)   Humiliation, Afraid, Rape, and Kick questionnaire    Fear of Current or Ex-Partner: No     Emotionally Abused: No    Physically Abused: No    Sexually Abused: No    Family History  Problem Relation Age of Onset   Breast cancer Sister    Colon cancer Sister     Allergies  Allergen Reactions   Ace Inhibitors     swelling of lip/face and closing of throat   Sulfa Antibiotics Itching    Review of Systems  All other systems reviewed and are negative.      Objective:   BP (!) 162/94   Pulse 68   Ht 5' 7 (1.702 m)   Wt 235 lb 9.6 oz (106.9 kg)   SpO2 93%   BMI 36.90 kg/m   Vitals:   12/27/23 0954  BP: (!) 162/94  Pulse: 68  Height: 5' 7 (1.702 m)  Weight: 235 lb 9.6 oz (106.9 kg)  SpO2: 93%  BMI (Calculated): 36.89    Physical Exam Vitals and nursing note reviewed.  Constitutional:      Appearance: Normal appearance. She is normal weight.  HENT:     Head: Normocephalic.   Eyes:     Extraocular Movements: Extraocular movements intact.     Conjunctiva/sclera: Conjunctivae normal.     Pupils: Pupils are equal, round, and reactive to light.    Cardiovascular:     Rate and Rhythm: Normal rate.  Pulmonary:     Effort: Pulmonary effort is normal.   Neurological:     General: No focal deficit present.     Mental Status: She is alert and oriented to person, place, and time. Mental status is at baseline.   Psychiatric:        Mood and Affect: Mood normal.        Behavior: Behavior normal.        Thought Content: Thought content normal.        Judgment: Judgment normal.      No results found for any visits on 12/27/23.  No results found for this or any previous visit (from the past 2160 hours).     Assessment & Plan:   Assessment & Plan Type 2 diabetes mellitus with hyperglycemia, without long-term current use of insulin  (HCC) Type 2 diabetes mellitus with other circulatory complications (HCC) Checking labs today. Will call pt. With results  Continue current diabetes POC, as patient has been well controlled on current regimen.  Will  adjust meds if needed based on labs.  Vitamin D  deficiency, unspecified B12 deficiency due to diet Checking labs today.  Will continue supplements as needed.  Hyperlipidemia associated with type 2 diabetes mellitus (HCC) Checking labs today.  Continue current therapy for lipid control. Will modify as needed based on labwork results.  Hypertension associated with diabetes (HCC) Blood pressure well controlled with current medications.  Continue current therapy.  Will reassess at follow up.  Hypothyroidism (acquired) Checking labs today.  Will continue supplements as needed.    Return in about 3 months (around 03/28/2024).   Total time spent: 20 minutes  Trenda Frisk, FNP  12/27/2023   This document may have been prepared by [facility] Voice Recognition software and as such may include unintentional dictation errors.

## 2023-12-27 NOTE — Assessment & Plan Note (Signed)
 Checking labs today.  Continue current therapy for lipid control. Will modify as needed based on labwork results.

## 2023-12-28 LAB — LIPID PANEL
Chol/HDL Ratio: 5.3 ratio — ABNORMAL HIGH (ref 0.0–4.4)
Cholesterol, Total: 272 mg/dL — ABNORMAL HIGH (ref 100–199)
HDL: 51 mg/dL (ref >39)
LDL Chol Calc (NIH): 183 mg/dL — ABNORMAL HIGH (ref 0–99)
Triglycerides: 204 mg/dL — ABNORMAL HIGH (ref 0–149)
VLDL Cholesterol Cal: 38 mg/dL (ref 5–40)

## 2023-12-28 LAB — CBC WITH DIFFERENTIAL/PLATELET
Basophils Absolute: 0.1 10*3/uL (ref 0.0–0.2)
Basos: 2 %
EOS (ABSOLUTE): 0.3 10*3/uL (ref 0.0–0.4)
Eos: 7 %
Hematocrit: 48.3 % — ABNORMAL HIGH (ref 34.0–46.6)
Hemoglobin: 15 g/dL (ref 11.1–15.9)
Immature Grans (Abs): 0 10*3/uL (ref 0.0–0.1)
Immature Granulocytes: 0 %
Lymphocytes Absolute: 1.8 10*3/uL (ref 0.7–3.1)
Lymphs: 36 %
MCH: 27.5 pg (ref 26.6–33.0)
MCHC: 31.1 g/dL — ABNORMAL LOW (ref 31.5–35.7)
MCV: 89 fL (ref 79–97)
Monocytes Absolute: 0.3 10*3/uL (ref 0.1–0.9)
Monocytes: 6 %
Neutrophils Absolute: 2.4 10*3/uL (ref 1.4–7.0)
Neutrophils: 49 %
Platelets: 219 10*3/uL (ref 150–450)
RBC: 5.45 x10E6/uL — ABNORMAL HIGH (ref 3.77–5.28)
RDW: 14.4 % (ref 11.7–15.4)
WBC: 4.9 10*3/uL (ref 3.4–10.8)

## 2023-12-28 LAB — CMP14+EGFR
ALT: 12 IU/L (ref 0–32)
AST: 19 IU/L (ref 0–40)
Albumin: 4.1 g/dL (ref 3.8–4.9)
Alkaline Phosphatase: 86 IU/L (ref 44–121)
BUN/Creatinine Ratio: 10 — ABNORMAL LOW (ref 12–28)
BUN: 7 mg/dL — ABNORMAL LOW (ref 8–27)
Bilirubin Total: 0.8 mg/dL (ref 0.0–1.2)
CO2: 17 mmol/L — ABNORMAL LOW (ref 20–29)
Calcium: 9 mg/dL (ref 8.7–10.3)
Chloride: 103 mmol/L (ref 96–106)
Creatinine, Ser: 0.71 mg/dL (ref 0.57–1.00)
Globulin, Total: 2.9 g/dL (ref 1.5–4.5)
Glucose: 85 mg/dL (ref 70–99)
Potassium: 4.4 mmol/L (ref 3.5–5.2)
Sodium: 140 mmol/L (ref 134–144)
Total Protein: 7 g/dL (ref 6.0–8.5)
eGFR: 97 mL/min/{1.73_m2} (ref >59)

## 2023-12-28 LAB — VITAMIN B12: Vitamin B-12: 252 pg/mL (ref 232–1245)

## 2023-12-28 LAB — TSH: TSH: 0.794 u[IU]/mL (ref 0.450–4.500)

## 2023-12-28 LAB — HEMOGLOBIN A1C
Est. average glucose Bld gHb Est-mCnc: 105 mg/dL
Hgb A1c MFr Bld: 5.3 % (ref 4.8–5.6)

## 2023-12-28 LAB — VITAMIN D 25 HYDROXY (VIT D DEFICIENCY, FRACTURES): Vit D, 25-Hydroxy: 25.8 ng/mL — ABNORMAL LOW (ref 30.0–100.0)

## 2023-12-30 DIAGNOSIS — G4733 Obstructive sleep apnea (adult) (pediatric): Secondary | ICD-10-CM | POA: Diagnosis not present

## 2024-01-01 DIAGNOSIS — G4733 Obstructive sleep apnea (adult) (pediatric): Secondary | ICD-10-CM | POA: Diagnosis not present

## 2024-01-03 DIAGNOSIS — G4733 Obstructive sleep apnea (adult) (pediatric): Secondary | ICD-10-CM | POA: Diagnosis not present

## 2024-01-13 ENCOUNTER — Other Ambulatory Visit: Payer: Self-pay

## 2024-01-13 ENCOUNTER — Ambulatory Visit: Payer: Self-pay

## 2024-01-13 DIAGNOSIS — E119 Type 2 diabetes mellitus without complications: Secondary | ICD-10-CM | POA: Diagnosis not present

## 2024-01-13 DIAGNOSIS — H2513 Age-related nuclear cataract, bilateral: Secondary | ICD-10-CM | POA: Diagnosis not present

## 2024-01-13 DIAGNOSIS — H11823 Conjunctivochalasis, bilateral: Secondary | ICD-10-CM | POA: Diagnosis not present

## 2024-01-13 MED ORDER — ROSUVASTATIN CALCIUM 10 MG PO TABS: 10.0000 mg | ORAL_TABLET | Freq: Every day | ORAL | 3 refills | Status: DC

## 2024-01-13 NOTE — Assessment & Plan Note (Signed)
 Checking labs today.  Continue current therapy for lipid control. Will modify as needed based on labwork results.   -CMP w/eGFR -Lipid Panel

## 2024-01-13 NOTE — Assessment & Plan Note (Signed)
 Checking labs today.  Will continue supplements as needed.   - Vitamin D  - Vitamin B12 - TSH

## 2024-01-13 NOTE — Assessment & Plan Note (Signed)
 Patient stable.  Well controlled with current therapy.   Continue current meds.

## 2024-01-13 NOTE — Assessment & Plan Note (Signed)
 Checking labs today. Will call pt. With results  Continue current diabetes POC, as patient has been well controlled on current regimen.  Will adjust meds if needed based on labs.   -CBC w/Diff -CMP w/eGFR -Hemoglobin A1C

## 2024-01-13 NOTE — Progress Notes (Signed)
 Complete physical exam  Patient: Tracy Hood   DOB: 1961-01-07   63 y.o. Female  MRN: [id number]  Subjective:    Chief Complaint  Patient presents with   Annual Exam    CPE and PAP    Tracy Hood is a 63 y.o. female who presents today for a complete physical exam. She reports consuming a general diet. Exercise is limited by orthopedic condition(s): Knee problems. She generally feels fairly well. She reports sleeping fairly well. She does not have additional problems to discuss today.    Most recent fall risk assessment:    07/09/2023    6:18 PM  Fall Risk   Falls in the past year? 0  Number falls in past yr: 0  Injury with Fall? 0  Risk for fall due to : Orthopedic patient  Follow up Education provided;Falls evaluation completed     Most recent depression screenings:    07/23/2023    2:25 PM 06/28/2023    4:00 PM  PHQ 2/9 Scores  PHQ - 2 Score 2 1  PHQ- 9 Score 4     Past Medical History:  Diagnosis Date   Acute hypoxemic respiratory failure (HCC) 08/17/2019   Acute respiratory failure with hypoxia (HCC) 08/16/2019   Anxiety    Appendicitis with peritoneal abscess 01/28/2018   Arthritis    COPD (chronic obstructive pulmonary disease) (HCC)    Diabetes mellitus without complication (HCC)    type 2   Dyspnea    Hypertension    IBS (irritable bowel syndrome)    Obesity    Sleep apnea    STARTED USING CPAP 3 MONTHS AGO    Past Surgical History:  Procedure Laterality Date   COLONOSCOPY WITH PROPOFOL  N/A 12/05/2018   Procedure: COLONOSCOPY WITH PROPOFOL ;  Surgeon: Gaylyn Gladis PENNER, MD;  Location: [facility] ENDOSCOPY;  Service: Endoscopy;  Laterality: N/A;   IR RADIOLOGIST EVAL & MGMT  02/05/2018   JOINT REPLACEMENT Left 2018   tkr   LAPAROSCOPIC APPENDECTOMY N/A 01/06/2019   Procedure: APPENDECTOMY LAPAROSCOPIC, DIABETIC;  Surgeon: Tye Millet, DO;  Location: [facility] ORS;  Service: General;  Laterality: N/A;   TUBAL LIGATION  1990    Family History   Problem Relation Age of Onset   Breast cancer Sister    Colon cancer Sister     Social History   Socioeconomic History   Marital status: Single    Spouse name: Not on file   Number of children: 5   Years of education: Not on file   Highest education level: Not on file  Occupational History   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    Comment: disability  Tobacco Use   Smoking status: Former    Current packs/day: 0.00    Types: Cigarettes    Quit date: 2019    Years since quitting: 6.5   Smokeless tobacco: Never  Vaping Use   Vaping status: Never Used  Substance and Sexual Activity   Alcohol use: Yes    Alcohol/week: 4.0 standard drinks of alcohol    Types: 4 Glasses of wine per week   Drug use: Never   Sexual activity: Not Currently    Birth control/protection: None  Other Topics Concern   Not on file  Social History Narrative   Not on file   Social Drivers of Health   Financial Resource Strain: Low Risk  (06/28/2023)   Overall Financial Resource Strain (CARDIA)    Difficulty of Paying Living Expenses: Not hard at all  Food  Insecurity: No Food Insecurity (06/28/2023)   Hunger Vital Sign    Worried About Running Out of Food in the Last Year: Never true    Ran Out of Food in the Last Year: Never true  Transportation Needs: No Transportation Needs (06/28/2023)   PRAPARE - Administrator, Civil Service (Medical): No    Lack of Transportation (Non-Medical): No  Physical Activity: Inactive (07/09/2023)   Exercise Vital Sign    Days of Exercise per Week: 0 days    Minutes of Exercise per Session: 0 min  Stress: No Stress Concern Present (06/28/2023)   [employer] of Occupational Health - Occupational Stress Questionnaire    Feeling of Stress : Not at all  Social Connections: Moderately Isolated (06/28/2023)   Social Connection and Isolation Panel    Frequency of Communication with Friends and Family: More than three times a week    Frequency of  Social Gatherings with Friends and Family: More than three times a week    Attends Religious Services: More than 4 times per year    Active Member of [employer] or Organizations: No    Attends Banker Meetings: Never    Marital Status: Divorced  Catering manager Violence: Not At Risk (06/28/2023)   Humiliation, Afraid, Rape, and Kick questionnaire    Fear of Current or Ex-Partner: No    Emotionally Abused: No    Physically Abused: No    Sexually Abused: No    Outpatient Medications Prior to Visit  Medication Sig   albuterol  (VENTOLIN  HFA) 108 (90 Base) MCG/ACT inhaler Inhale 2 puffs into the lungs every 6 (six) hours as needed for wheezing or shortness of breath.   amLODipine  (NORVASC ) 10 MG tablet TAKE 1 TABLET BY MOUTH EVERY DAY   cyclobenzaprine  (FLEXERIL ) 5 MG tablet Take 5 mg by mouth 2 (two) times daily as needed.   Fluticasone -Umeclidin-Vilant (TRELEGY ELLIPTA ) 100-62.5-25 MCG/ACT AEPB INHALE 1 PUFF ONCE DAILY. RINCE MOUTH WELL AFTER USE.   Multiple Vitamins-Minerals (MULTIVITAMIN WOMEN 50+ PO) Take 1 tablet by mouth daily.   tirzepatide  (MOUNJARO ) 5 MG/0.5ML Pen INJECT 5 MG SUBCUTANEOUSLY WEEKLY   [DISCONTINUED] amitriptyline  (ELAVIL ) 25 MG tablet TAKE 2 TABLETS (50 MG TOTAL) BY MOUTH AT BEDTIME AS NEEDED FOR SLEEP.   [DISCONTINUED] rosuvastatin  (CRESTOR ) 40 MG tablet Take 1 tablet (40 mg total) by mouth daily.   icosapent  Ethyl (VASCEPA ) 1 g capsule Take 2 capsules (2 g total) by mouth 2 (two) times daily. (Patient not taking: Reported on 12/27/2023)   Zoster Vaccine Adjuvanted [facility]) injection Inject 0.5 mLs into the muscle once for 1 dose. Repeat in 6 months (Patient not taking: Reported on 12/27/2023)   [DISCONTINUED] Eluxadoline  (VIBERZI ) 75 MG TABS Take 1 tablet (75 mg total) by mouth 2 (two) times daily. (Patient not taking: Reported on 12/27/2023)   [DISCONTINUED] Semaglutide  (RYBELSUS ) 14 MG TABS Take 1 tablet (14 mg total) by mouth every morning. (Patient not  taking: Reported on 12/27/2023)   No facility-administered medications prior to visit.    Review of Systems  All other systems reviewed and are negative.       Objective:     BP 114/82   Pulse 73   Ht 5' 7 (1.702 m)   Wt 237 lb 9.6 oz (107.8 kg)   SpO2 96%   BMI 37.21 kg/m   Physical Exam Vitals and nursing note reviewed.  Constitutional:      Appearance: Normal appearance. She is normal weight.  HENT:  Head: Normocephalic.  Eyes:     Extraocular Movements: Extraocular movements intact.     Conjunctiva/sclera: Conjunctivae normal.     Pupils: Pupils are equal, round, and reactive to light.  Cardiovascular:     Rate and Rhythm: Normal rate.  Pulmonary:     Effort: Pulmonary effort is normal.  Neurological:     General: No focal deficit present.     Mental Status: She is alert and oriented to person, place, and time. Mental status is at baseline.  Psychiatric:        Mood and Affect: Mood normal.        Behavior: Behavior normal.        Thought Content: Thought content normal.      Results for orders placed or performed in visit on 09/26/23  Lipid panel  Result Value Ref Range   Cholesterol, Total CANCELED mg/dL   Triglycerides CANCELED mg/dL   HDL CANCELED mg/dL   VLDL Cholesterol Cal CANCELED mg/dL   LDL Chol Calc (NIH) CANCELED mg/dL   LDL CALC COMMENT: CANCELED    Chol/HDL Ratio CANCELED ratio  VITAMIN D  25 Hydroxy (Vit-D Deficiency, Fractures)  Result Value Ref Range   Vit D, 25-Hydroxy 31.6 30.0 - 100.0 ng/mL  CMP14+EGFR  Result Value Ref Range   Glucose CANCELED mg/dL   BUN CANCELED mg/dL   Creatinine, Ser CANCELED mg/dL   BUN/Creatinine Ratio CANCELED    Sodium CANCELED mmol/L   Potassium CANCELED mmol/L   Chloride CANCELED mmol/L   CO2 CANCELED mmol/L   Calcium  CANCELED mg/dL   Total Protein CANCELED g/dL   Albumin CANCELED g/dL   Globulin, Total CANCELED g/dL   Bilirubin Total CANCELED mg/dL   Alkaline Phosphatase CANCELED IU/L    AST CANCELED IU/L   ALT CANCELED IU/L  TSH  Result Value Ref Range   TSH CANCELED uIU/mL  Hemoglobin A1c  Result Value Ref Range   Hgb A1c MFr Bld 5.6 4.8 - 5.6 %   Est. average glucose Bld gHb Est-mCnc 114 mg/dL  Vitamin A87  Result Value Ref Range   Vitamin B-12 359 232 - 1,245 pg/mL  CBC with Diff  Result Value Ref Range   WBC 5.9 3.4 - 10.8 x10E3/uL   RBC 5.94 (H) 3.77 - 5.28 x10E6/uL   Hemoglobin 16.4 (H) 11.1 - 15.9 g/dL   Hematocrit 49.4 (H) 65.9 - 46.6 %   MCV 85 79 - 97 fL   MCH 27.6 26.6 - 33.0 pg   MCHC 32.5 31.5 - 35.7 g/dL   RDW 86.3 88.2 - 84.5 %   Platelets 224 150 - 450 x10E3/uL   Neutrophils 51 Not Estab. %   Lymphs 36 Not Estab. %   Monocytes 6 Not Estab. %   Eos 5 Not Estab. %   Basos 2 Not Estab. %   Neutrophils Absolute 3.0 1.4 - 7.0 x10E3/uL   Lymphocytes Absolute 2.1 0.7 - 3.1 x10E3/uL   Monocytes Absolute 0.4 0.1 - 0.9 x10E3/uL   EOS (ABSOLUTE) 0.3 0.0 - 0.4 x10E3/uL   Basophils Absolute 0.1 0.0 - 0.2 x10E3/uL   Immature Granulocytes 0 Not Estab. %   Immature Grans (Abs) 0.0 0.0 - 0.1 x10E3/uL  IGP,CtNgTv,Apt HPV,rfx16/18,45  Result Value Ref Range   DIAGNOSIS: Comment    Specimen adequacy: Comment    Clinician Provided ICD10 Comment    Performed by: Comment    PAP Smear Comment .    Note: Comment    Test Methodology Comment    HPV  Aptima Negative Negative   HPV Genotype Reflex Comment    Chlamydia, Nuc. Acid Amp Negative Negative   Gonococcus, Nuc. Acid Amp Negative Negative   Trich vag by NAA Negative Negative    Recent Results (from the past 2160 hours)  POC CREATINE & ALBUMIN,URINE     Status: Abnormal   Collection Time: 12/27/23 10:23 AM  Result Value Ref Range   Microalbumin Ur, POC 150 mg/L   Creatinine, POC 200 mg/dL   Albumin/Creatinine Ratio, Urine, POC 30-300   POCT Urinalysis Dipstick (18997)     Status: Abnormal   Collection Time: 12/27/23 10:27 AM  Result Value Ref Range   Color, UA Dark yellow    Clarity, UA Cloudy     Glucose, UA Negative Negative   Bilirubin, UA Negative    Ketones, UA Negative    Spec Grav, UA 1.025 1.010 - 1.025   Blood, UA Trace    pH, UA 6.0 5.0 - 8.0   Protein, UA Positive (A) Negative   Urobilinogen, UA 0.2 0.2 or 1.0 E.U./dL   Nitrite, UA Negative    Leukocytes, UA Negative Negative   Appearance Cloudy    Odor Yes   CMP14+EGFR     Status: Abnormal   Collection Time: 12/27/23 10:43 AM  Result Value Ref Range   Glucose 85 70 - 99 mg/dL   BUN 7 (L) 8 - 27 mg/dL   Creatinine, Ser 9.28 0.57 - 1.00 mg/dL   eGFR 97 >40 fO/fpw/8.26   BUN/Creatinine Ratio 10 (L) 12 - 28   Sodium 140 134 - 144 mmol/L   Potassium 4.4 3.5 - 5.2 mmol/L   Chloride 103 96 - 106 mmol/L   CO2 17 (L) 20 - 29 mmol/L   Calcium  9.0 8.7 - 10.3 mg/dL   Total Protein 7.0 6.0 - 8.5 g/dL   Albumin 4.1 3.8 - 4.9 g/dL   Globulin, Total 2.9 1.5 - 4.5 g/dL   Bilirubin Total 0.8 0.0 - 1.2 mg/dL   Alkaline Phosphatase 86 44 - 121 IU/L   AST 19 0 - 40 IU/L   ALT 12 0 - 32 IU/L  Lipid panel     Status: Abnormal   Collection Time: 12/27/23 10:43 AM  Result Value Ref Range   Cholesterol, Total 272 (H) 100 - 199 mg/dL   Triglycerides 795 (H) 0 - 149 mg/dL   HDL 51 >60 mg/dL   VLDL Cholesterol Cal 38 5 - 40 mg/dL   LDL Chol Calc (NIH) 816 (H) 0 - 99 mg/dL   Chol/HDL Ratio 5.3 (H) 0.0 - 4.4 ratio    Comment:                                   T. Chol/HDL Ratio                                             Men  Women                               1/2 Avg.Risk  3.4    3.3  Avg.Risk  5.0    4.4                                2X Avg.Risk  9.6    7.1                                3X Avg.Risk 23.4   11.0   VITAMIN D  25 Hydroxy (Vit-D Deficiency, Fractures)     Status: Abnormal   Collection Time: 12/27/23 10:43 AM  Result Value Ref Range   Vit D, 25-Hydroxy 25.8 (L) 30.0 - 100.0 ng/mL    Comment: Vitamin D  deficiency has been defined by the Institute of Medicine and an Endocrine  Society practice guideline as a level of serum 25-OH vitamin D  less than 20 ng/mL (1,2). The Endocrine Society went on to further define vitamin D  insufficiency as a level between 21 and 29 ng/mL (2). 1. IOM (Institute of Medicine). 2010. Dietary reference    intakes for calcium  and D. [place]: The    [employer]. 2. Holick MF, Binkley Millwood, Bischoff-Ferrari HA, et al.    Evaluation, treatment, and prevention of vitamin D     deficiency: an Endocrine Society clinical practice    guideline. JCEM. 2011 Jul; 96(7):1911-30.   Vitamin B12     Status: None   Collection Time: 12/27/23 10:43 AM  Result Value Ref Range   Vitamin B-12 252 232 - 1,245 pg/mL  CBC with Diff     Status: Abnormal   Collection Time: 12/27/23 10:43 AM  Result Value Ref Range   WBC 4.9 3.4 - 10.8 x10E3/uL   RBC 5.45 (H) 3.77 - 5.28 x10E6/uL   Hemoglobin 15.0 11.1 - 15.9 g/dL   Hematocrit 51.6 (H) 65.9 - 46.6 %   MCV 89 79 - 97 fL   MCH 27.5 26.6 - 33.0 pg   MCHC 31.1 (L) 31.5 - 35.7 g/dL   RDW 85.5 88.2 - 84.5 %   Platelets 219 150 - 450 x10E3/uL   Neutrophils 49 Not Estab. %   Lymphs 36 Not Estab. %   Monocytes 6 Not Estab. %   Eos 7 Not Estab. %   Basos 2 Not Estab. %   Neutrophils Absolute 2.4 1.4 - 7.0 x10E3/uL   Lymphocytes Absolute 1.8 0.7 - 3.1 x10E3/uL   Monocytes Absolute 0.3 0.1 - 0.9 x10E3/uL   EOS (ABSOLUTE) 0.3 0.0 - 0.4 x10E3/uL   Basophils Absolute 0.1 0.0 - 0.2 x10E3/uL   Immature Granulocytes 0 Not Estab. %   Immature Grans (Abs) 0.0 0.0 - 0.1 x10E3/uL  Hemoglobin A1c     Status: None   Collection Time: 12/27/23 10:43 AM  Result Value Ref Range   Hgb A1c MFr Bld 5.3 4.8 - 5.6 %    Comment:          Prediabetes: 5.7 - 6.4          Diabetes: >6.4          Glycemic control for adults with diabetes: <7.0    Est. average glucose Bld gHb Est-mCnc 105 mg/dL  TSH     Status: None   Collection Time: 12/27/23 10:43 AM  Result Value Ref Range   TSH 0.794 0.450 - 4.500 uIU/mL         Assessment & Plan:    Routine Health Maintenance and Physical Exam  Immunization  History  Administered Date(s) Administered   PNEUMOCOCCAL CONJUGATE-20 07/13/2022   Pfizer(Comirnaty)Fall Seasonal Vaccine 12 years and older 03/18/2023   Zoster Recombinant(Shingrix) 03/18/2023, 07/18/2023    Health Maintenance  Topic Date Due   OPHTHALMOLOGY EXAM  Never done   DTaP/Tdap/Td (1 - Tdap) Never done   FOOT EXAM  05/02/2023   INFLUENZA VACCINE  02/07/2024   HEMOGLOBIN A1C  06/27/2024   Medicare Annual Wellness (AWV)  06/27/2024   Diabetic kidney evaluation - eGFR measurement  12/26/2024   Diabetic kidney evaluation - Urine ACR  12/26/2024   MAMMOGRAM  12/19/2025   Cervical Cancer Screening (HPV/Pap Cotest)  09/25/2028   Colonoscopy  12/04/2028   Pneumococcal Vaccine 88-40 Years old  Completed   COVID-19 Vaccine  Completed   Hepatitis C Screening  Completed   HIV Screening  Completed   Zoster Vaccines- Shingrix  Completed   Hepatitis B Vaccines  Aged Out   HPV VACCINES  Aged Out   Meningococcal B Vaccine  Aged Out    Discussed health benefits of physical activity, and encouraged her to engage in regular exercise appropriate for her age and condition.  Assessment & Plan Routine general medical examination at a health care facility  Vaginal Pap smear Screening for cervical cancer Screening for human papillomavirus (HPV) Exposure to sexually transmitted disease (STD) Acute vaginitis Abnormal cervical Papanicolaou smear, unspecified abnormal pap finding Pap smear done in office today.  Will call with results when available.   Type 2 diabetes mellitus with hyperglycemia, without long-term current use of insulin  (HCC) Checking labs today. Will call pt. With results  Continue current diabetes POC, as patient has been well controlled on current regimen.  Will adjust meds if needed based on labs.   -CBC w/Diff -CMP w/eGFR -Hemoglobin A1C  Hyperlipidemia associated  with type 2 diabetes mellitus (HCC) Checking labs today.  Continue current therapy for lipid control. Will modify as needed based on labwork results.   -CMP w/eGFR -Lipid Panel  Vitamin D  deficiency, unspecified B12 deficiency due to diet Hypothyroidism (acquired) Checking labs today.  Will continue supplements as needed.   - Vitamin D  - Vitamin B12 - TSH  OSA (obstructive sleep apnea) Patient stable.  Well controlled with current therapy.   Continue current meds.     Return in about 3 months (around 12/27/2023).     ALAN CHRISTELLA ARRANT, FNP  09/26/2023   This document may have been prepared by [facility] Voice Recognition software and as such may include unintentional dictation errors.

## 2024-01-14 DIAGNOSIS — H5213 Myopia, bilateral: Secondary | ICD-10-CM | POA: Diagnosis not present

## 2024-01-15 ENCOUNTER — Ambulatory Visit: Payer: Self-pay | Admitting: Family

## 2024-01-15 ENCOUNTER — Encounter: Payer: Self-pay | Admitting: Family

## 2024-01-20 DIAGNOSIS — J449 Chronic obstructive pulmonary disease, unspecified: Secondary | ICD-10-CM | POA: Diagnosis not present

## 2024-01-29 DIAGNOSIS — G4733 Obstructive sleep apnea (adult) (pediatric): Secondary | ICD-10-CM | POA: Diagnosis not present

## 2024-02-07 DIAGNOSIS — G4733 Obstructive sleep apnea (adult) (pediatric): Secondary | ICD-10-CM | POA: Diagnosis not present

## 2024-02-10 ENCOUNTER — Other Ambulatory Visit: Payer: Self-pay | Admitting: Family

## 2024-02-20 DIAGNOSIS — J449 Chronic obstructive pulmonary disease, unspecified: Secondary | ICD-10-CM | POA: Diagnosis not present

## 2024-02-21 ENCOUNTER — Other Ambulatory Visit: Payer: Self-pay | Admitting: Family

## 2024-02-29 DIAGNOSIS — G4733 Obstructive sleep apnea (adult) (pediatric): Secondary | ICD-10-CM | POA: Diagnosis not present

## 2024-03-30 ENCOUNTER — Ambulatory Visit: Admitting: Family

## 2024-04-15 ENCOUNTER — Ambulatory Visit (INDEPENDENT_AMBULATORY_CARE_PROVIDER_SITE_OTHER): Admitting: Family

## 2024-04-15 ENCOUNTER — Encounter: Payer: Self-pay | Admitting: Family

## 2024-04-15 VITALS — BP 148/98 | HR 65 | Ht 64.0 in | Wt 233.2 lb

## 2024-04-15 DIAGNOSIS — I152 Hypertension secondary to endocrine disorders: Secondary | ICD-10-CM | POA: Diagnosis not present

## 2024-04-15 DIAGNOSIS — E785 Hyperlipidemia, unspecified: Secondary | ICD-10-CM

## 2024-04-15 DIAGNOSIS — E1159 Type 2 diabetes mellitus with other circulatory complications: Secondary | ICD-10-CM

## 2024-04-15 DIAGNOSIS — E559 Vitamin D deficiency, unspecified: Secondary | ICD-10-CM

## 2024-04-15 DIAGNOSIS — R0981 Nasal congestion: Secondary | ICD-10-CM | POA: Insufficient documentation

## 2024-04-15 DIAGNOSIS — E1169 Type 2 diabetes mellitus with other specified complication: Secondary | ICD-10-CM

## 2024-04-15 DIAGNOSIS — E039 Hypothyroidism, unspecified: Secondary | ICD-10-CM | POA: Diagnosis not present

## 2024-04-15 DIAGNOSIS — J441 Chronic obstructive pulmonary disease with (acute) exacerbation: Secondary | ICD-10-CM | POA: Diagnosis not present

## 2024-04-15 DIAGNOSIS — E1165 Type 2 diabetes mellitus with hyperglycemia: Secondary | ICD-10-CM | POA: Diagnosis not present

## 2024-04-15 DIAGNOSIS — E538 Deficiency of other specified B group vitamins: Secondary | ICD-10-CM | POA: Insufficient documentation

## 2024-04-15 MED ORDER — TRELEGY ELLIPTA 100-62.5-25 MCG/ACT IN AEPB: INHALATION_SPRAY | RESPIRATORY_TRACT | 0 refills | Status: AC

## 2024-04-15 MED ORDER — CYCLOBENZAPRINE HCL 5 MG PO TABS: 5.0000 mg | ORAL_TABLET | Freq: Two times a day (BID) | ORAL | 2 refills | Status: AC | PRN

## 2024-04-15 MED ORDER — ROSUVASTATIN CALCIUM 10 MG PO TABS: 10.0000 mg | ORAL_TABLET | Freq: Every day | ORAL | 3 refills | Status: AC

## 2024-04-15 MED ORDER — ALBUTEROL SULFATE HFA 108 (90 BASE) MCG/ACT IN AERS: 2.0000 | INHALATION_SPRAY | Freq: Four times a day (QID) | RESPIRATORY_TRACT | 2 refills | Status: AC | PRN

## 2024-04-15 MED ORDER — AMLODIPINE BESYLATE 10 MG PO TABS: 10.0000 mg | ORAL_TABLET | Freq: Every day | ORAL | 1 refills | Status: AC

## 2024-04-15 MED ORDER — FLUTICASONE PROPIONATE 50 MCG/ACT NA SUSP: 1.0000 | Freq: Two times a day (BID) | NASAL | 1 refills | Status: AC

## 2024-04-15 MED ORDER — MOUNJARO 5 MG/0.5ML ~~LOC~~ SOAJ
5.0000 mg | SUBCUTANEOUS | 3 refills | Status: AC
Start: 2024-04-15 — End: ?

## 2024-04-15 NOTE — Assessment & Plan Note (Signed)
-   stable. - Labs due today. - Continue taking medications as prescribed - Reinforced healthy diet and exercise as tolerated.

## 2024-04-15 NOTE — Assessment & Plan Note (Signed)
-   Continue taking medications as prescribed. - FU if symptoms fail to improve or worsen.

## 2024-04-15 NOTE — Assessment & Plan Note (Signed)
-   Labs due today. - Continue taking medications as prescribed.

## 2024-04-15 NOTE — Progress Notes (Signed)
 Established Patient Office Visit  Subjective:  Patient ID: Tracy Hood, female    DOB: March 08, 1961  Age: 63 y.o. MRN: [id number]  Chief Complaint  Patient presents with   Follow-up    3 month follow up    Patient is here today for her 3 months follow up.  She has been feeling fairly well since last appointment.   She does have additional concerns to discuss today. She reports sinus congestion that continues to get worse for the last 2 weeks but endorses body aches that started yesterday. Patient denies fever, chills, sore throat. Continue using Flonase  nasal spray and FU if symptoms worsen. Labs are due today. She needs refills. May adjust Mounjaro  dose based off lab results. Patient is currently on Mounjaro  5 mg weekly injection.  I have reviewed her active problem list, medication list, allergies, family history, social history, health maintenance, notes from last encounter, lab results for her appointment today.    Patient reports already having flu, covid, pneumonia vaccines completed. She will get RSV vaccine.    No other concerns at this time.   Past Medical History:  Diagnosis Date   Acute hypoxemic respiratory failure (HCC) 08/17/2019   Acute respiratory failure with hypoxia (HCC) 08/16/2019   Anxiety    Appendicitis with peritoneal abscess 01/28/2018   Arthritis    COPD (chronic obstructive pulmonary disease) (HCC)    Diabetes mellitus without complication (HCC)    type 2   Dyspnea    Hypertension    IBS (irritable bowel syndrome)    Obesity    Sleep apnea    STARTED USING CPAP 3 MONTHS AGO    Past Surgical History:  Procedure Laterality Date   COLONOSCOPY WITH PROPOFOL  N/A 12/05/2018   Procedure: COLONOSCOPY WITH PROPOFOL ;  Surgeon: Gaylyn Gladis PENNER, MD;  Location: [facility] ENDOSCOPY;  Service: Endoscopy;  Laterality: N/A;   IR RADIOLOGIST EVAL & MGMT  02/05/2018   JOINT REPLACEMENT Left 2018   tkr   LAPAROSCOPIC APPENDECTOMY N/A 01/06/2019   Procedure:  APPENDECTOMY LAPAROSCOPIC, DIABETIC;  Surgeon: Tye Millet, DO;  Location: [facility] ORS;  Service: General;  Laterality: N/A;   TUBAL LIGATION  1990    Social History   Socioeconomic History   Marital status: Single    Spouse name: Not on file   Number of children: 5   Years of education: Not on file   Highest education level: Not on file  Occupational History   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    Comment: disability  Tobacco Use   Smoking status: Some Days    Current packs/day: 0.00    Types: Cigarettes    Last attempt to quit: 2019    Years since quitting: 6.7   Smokeless tobacco: Never  Vaping Use   Vaping status: Never Used  Substance and Sexual Activity   Alcohol use: Yes    Alcohol/week: 4.0 standard drinks of alcohol    Types: 4 Glasses of wine per week   Drug use: Never   Sexual activity: Not Currently    Birth control/protection: None  Other Topics Concern   Not on file  Social History Narrative   Not on file   Social Drivers of Health   Financial Resource Strain: Low Risk  (06/28/2023)   Overall Financial Resource Strain (CARDIA)    Difficulty of Paying Living Expenses: Not hard at all  Food Insecurity: No Food Insecurity (06/28/2023)   Hunger Vital Sign    Worried About Running Out of Food in the  Last Year: Never true    Ran Out of Food in the Last Year: Never true  Transportation Needs: No Transportation Needs (06/28/2023)   PRAPARE - Administrator, Civil Service (Medical): No    Lack of Transportation (Non-Medical): No  Physical Activity: Inactive (07/09/2023)   Exercise Vital Sign    Days of Exercise per Week: 0 days    Minutes of Exercise per Session: 0 min  Stress: No Stress Concern Present (06/28/2023)   [employer] of Occupational Health - Occupational Stress Questionnaire    Feeling of Stress : Not at all  Social Connections: Moderately Isolated (06/28/2023)   Social Connection and Isolation Panel    Frequency of  Communication with Friends and Family: More than three times a week    Frequency of Social Gatherings with Friends and Family: More than three times a week    Attends Religious Services: More than 4 times per year    Active Member of [employer] or Organizations: No    Attends Banker Meetings: Never    Marital Status: Divorced  Catering manager Violence: Not At Risk (06/28/2023)   Humiliation, Afraid, Rape, and Kick questionnaire    Fear of Current or Ex-Partner: No    Emotionally Abused: No    Physically Abused: No    Sexually Abused: No    Family History  Problem Relation Age of Onset   Breast cancer Sister    Colon cancer Sister     Allergies  Allergen Reactions   Ace Inhibitors     swelling of lip/face and closing of throat   Sulfa Antibiotics Itching    Review of Systems  Constitutional:  Positive for malaise/fatigue (body aches).  HENT:  Positive for congestion.   Eyes:  Negative for blurred vision and pain.  Respiratory:  Negative for cough and shortness of breath.   Cardiovascular:  Negative for chest pain, palpitations, claudication and leg swelling.  Gastrointestinal:  Negative for abdominal pain, blood in stool, constipation, diarrhea, nausea and vomiting.  Genitourinary:  Negative for dysuria, frequency and urgency.  Musculoskeletal: Negative.   Skin: Negative.   Neurological:  Negative for dizziness, tingling, sensory change and headaches.  Endo/Heme/Allergies: Negative.   Psychiatric/Behavioral: Negative.         Objective:   BP (!) 148/98   Pulse 65   Ht 5' 4 (1.626 m)   Wt 233 lb 3.2 oz (105.8 kg)   SpO2 96%   BMI 40.03 kg/m   Vitals:   04/15/24 1032  BP: (!) 148/98  Pulse: 65  Height: 5' 4 (1.626 m)  Weight: 233 lb 3.2 oz (105.8 kg)  SpO2: 96%  BMI (Calculated): 40.01    Physical Exam Vitals and nursing note reviewed.  Constitutional:      Appearance: Normal appearance.  HENT:     Head: Normocephalic.  Eyes:      Extraocular Movements: Extraocular movements intact.     Pupils: Pupils are equal, round, and reactive to light.  Cardiovascular:     Rate and Rhythm: Normal rate and regular rhythm.     Pulses: Normal pulses.     Heart sounds: Normal heart sounds. No murmur heard. Pulmonary:     Effort: Pulmonary effort is normal. No respiratory distress.     Breath sounds: Normal breath sounds.  Abdominal:     General: There is no distension.     Tenderness: There is no abdominal tenderness.  Musculoskeletal:        General:  No tenderness. Normal range of motion.     Cervical back: Normal range of motion and neck supple.     Right lower leg: No edema.     Left lower leg: No edema.  Skin:    General: Skin is warm and dry.     Coloration: Skin is not jaundiced.     Findings: No erythema.  Neurological:     General: No focal deficit present.     Mental Status: She is alert and oriented to person, place, and time.  Psychiatric:        Mood and Affect: Mood normal.        Speech: Speech normal.        Behavior: Behavior is cooperative.        Cognition and Memory: Memory is not impaired.      No results found for any visits on 04/15/24.  No results found for this or any previous visit (from the past 2160 hours).     Assessment & Plan:   Assessment & Plan Type 2 diabetes mellitus with hyperglycemia, without long-term current use of insulin  (HCC) Hyperlipidemia associated with type 2 diabetes mellitus (HCC) Hypertension associated with diabetes (HCC) - stable. - Labs due today. - Continue taking medications as prescribed - Reinforced healthy diet and exercise as tolerated. B12 deficiency due to diet Vitamin D  deficiency, unspecified Hypothyroidism (acquired) - Labs due today. - Continue taking medications as prescribed. Chronic obstructive pulmonary disease with (acute) exacerbation (HCC) Sinus congestion - Continue taking medications as prescribed. - FU if symptoms fail to improve  or worsen.    Return in about 2 months (around 06/15/2024) for annual wellness visit.   Total time spent: 25 minutes  Oddis DELENA Cain, FNP  04/15/2024   This document may have been prepared by [facility] Voice Recognition software and as such may include unintentional dictation errors.

## 2024-04-16 LAB — CMP14+EGFR
ALT: 16 IU/L (ref 0–32)
AST: 14 IU/L (ref 0–40)
Albumin: 4.5 g/dL (ref 3.8–4.9)
Alkaline Phosphatase: 90 IU/L (ref 49–135)
BUN/Creatinine Ratio: 10 — ABNORMAL LOW (ref 12–28)
BUN: 7 mg/dL — ABNORMAL LOW (ref 8–27)
Bilirubin Total: 0.8 mg/dL (ref 0.0–1.2)
CO2: 19 mmol/L — ABNORMAL LOW (ref 20–29)
Calcium: 9.2 mg/dL (ref 8.7–10.3)
Chloride: 104 mmol/L (ref 96–106)
Creatinine, Ser: 0.69 mg/dL (ref 0.57–1.00)
Globulin, Total: 2.6 g/dL (ref 1.5–4.5)
Glucose: 91 mg/dL (ref 70–99)
Potassium: 4 mmol/L (ref 3.5–5.2)
Sodium: 142 mmol/L (ref 134–144)
Total Protein: 7.1 g/dL (ref 6.0–8.5)
eGFR: 99 mL/min/1.73 (ref >59)

## 2024-04-16 LAB — CBC WITH DIFFERENTIAL/PLATELET
Basophils Absolute: 0.1 x10E3/uL (ref 0.0–0.2)
Basos: 1 %
EOS (ABSOLUTE): 0.4 x10E3/uL (ref 0.0–0.4)
Eos: 6 %
Hematocrit: 45.9 % (ref 34.0–46.6)
Hemoglobin: 14.7 g/dL (ref 11.1–15.9)
Immature Grans (Abs): 0 x10E3/uL (ref 0.0–0.1)
Immature Granulocytes: 0 %
Lymphocytes Absolute: 2.1 x10E3/uL (ref 0.7–3.1)
Lymphs: 31 %
MCH: 27.6 pg (ref 26.6–33.0)
MCHC: 32 g/dL (ref 31.5–35.7)
MCV: 86 fL (ref 79–97)
Monocytes Absolute: 0.4 x10E3/uL (ref 0.1–0.9)
Monocytes: 7 %
Neutrophils Absolute: 3.7 x10E3/uL (ref 1.4–7.0)
Neutrophils: 55 %
Platelets: 206 x10E3/uL (ref 150–450)
RBC: 5.33 x10E6/uL — ABNORMAL HIGH (ref 3.77–5.28)
RDW: 14.1 % (ref 11.7–15.4)
WBC: 6.7 x10E3/uL (ref 3.4–10.8)

## 2024-04-16 LAB — HEMOGLOBIN A1C
Est. average glucose Bld gHb Est-mCnc: 120 mg/dL
Hgb A1c MFr Bld: 5.8 % — ABNORMAL HIGH (ref 4.8–5.6)

## 2024-04-16 LAB — LIPID PANEL
Chol/HDL Ratio: 3.6 ratio (ref 0.0–4.4)
Cholesterol, Total: 215 mg/dL — ABNORMAL HIGH (ref 100–199)
HDL: 59 mg/dL (ref >39)
LDL Chol Calc (NIH): 127 mg/dL — ABNORMAL HIGH (ref 0–99)
Triglycerides: 164 mg/dL — ABNORMAL HIGH (ref 0–149)
VLDL Cholesterol Cal: 29 mg/dL (ref 5–40)

## 2024-04-16 LAB — TSH: TSH: 0.98 u[IU]/mL (ref 0.450–4.500)

## 2024-04-16 LAB — VITAMIN D 25 HYDROXY (VIT D DEFICIENCY, FRACTURES): Vit D, 25-Hydroxy: 24.4 ng/mL — ABNORMAL LOW (ref 30.0–100.0)

## 2024-04-16 LAB — VITAMIN B12: Vitamin B-12: 249 pg/mL (ref 232–1245)

## 2024-04-21 DIAGNOSIS — J449 Chronic obstructive pulmonary disease, unspecified: Secondary | ICD-10-CM | POA: Diagnosis not present

## 2024-04-30 DIAGNOSIS — G4733 Obstructive sleep apnea (adult) (pediatric): Secondary | ICD-10-CM | POA: Diagnosis not present

## 2024-06-30 ENCOUNTER — Ambulatory Visit: Admitting: Family

## 2024-06-30 ENCOUNTER — Encounter: Payer: Self-pay | Admitting: Family

## 2024-06-30 DIAGNOSIS — Z0001 Encounter for general adult medical examination with abnormal findings: Secondary | ICD-10-CM

## 2024-06-30 DIAGNOSIS — I152 Hypertension secondary to endocrine disorders: Secondary | ICD-10-CM | POA: Diagnosis not present

## 2024-06-30 NOTE — Progress Notes (Signed)
 "   Annual Wellness Visit  Patient: Tracy Hood, Female    DOB: 1961/03/18, 63 y.o.   MRN: [id number] Visit Date: 06/30/2024  Today's Provider: ALAN CHRISTELLA ARRANT, FNP  Subjective:    Chief Complaint  Patient presents with   Annual Exam    AWV   Tracy Hood is a 63 y.o. female who presents today for her Annual Wellness Visit.   Past Medical History:  Diagnosis Date   Acute hypoxemic respiratory failure (HCC) 08/17/2019   Acute respiratory failure with hypoxia (HCC) 08/16/2019   Anxiety    Appendicitis with peritoneal abscess 01/28/2018   Arthritis    COPD (chronic obstructive pulmonary disease) (HCC)    Diabetes mellitus without complication (HCC)    type 2   Dyspnea    Hypertension    IBS (irritable bowel syndrome)    Obesity    Sleep apnea    STARTED USING CPAP 3 MONTHS AGO   Past Surgical History:  Procedure Laterality Date   COLONOSCOPY WITH PROPOFOL  N/A 12/05/2018   Procedure: COLONOSCOPY WITH PROPOFOL ;  Surgeon: Gaylyn Gladis PENNER, MD;  Location: [facility] ENDOSCOPY;  Service: Endoscopy;  Laterality: N/A;   IR RADIOLOGIST EVAL & MGMT  02/05/2018   JOINT REPLACEMENT Left 2018   tkr   LAPAROSCOPIC APPENDECTOMY N/A 01/06/2019   Procedure: APPENDECTOMY LAPAROSCOPIC, DIABETIC;  Surgeon: Tye Millet, DO;  Location: [facility] ORS;  Service: General;  Laterality: N/A;   TUBAL LIGATION  1990   Family History  Problem Relation Age of Onset   Breast cancer Sister    Colon cancer Sister    Social History   Socioeconomic History   Marital status: Single    Spouse name: Not on file   Number of children: 5   Years of education: Not on file   Highest education level: Not on file  Occupational History   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    Comment: disability  Tobacco Use   Smoking status: Some Days    Current packs/day: 0.00    Types: Cigarettes    Last attempt to quit: 2019    Years since quitting: 6.9   Smokeless tobacco: Never  Vaping Use   Vaping status: Never  Used  Substance and Sexual Activity   Alcohol use: Yes    Alcohol/week: 4.0 standard drinks of alcohol    Types: 4 Glasses of wine per week   Drug use: Never   Sexual activity: Not Currently    Birth control/protection: None  Other Topics Concern   Not on file  Social History Narrative   Not on file   Social Drivers of Health   Tobacco Use: High Risk (06/30/2024)   Patient History    Smoking Tobacco Use: Some Days    Smokeless Tobacco Use: Never    Passive Exposure: Not on file  Financial Resource Strain: Low Risk (06/28/2023)   Overall Financial Resource Strain (CARDIA)    Difficulty of Paying Living Expenses: Not hard at all  Food Insecurity: No Food Insecurity (06/30/2024)   Epic    Worried About Radiation Protection Practitioner of Food in the Last Year: Never true    Ran Out of Food in the Last Year: Never true  Transportation Needs: No Transportation Needs (06/30/2024)   Epic    Lack of Transportation (Medical): No    Lack of Transportation (Non-Medical): No  Physical Activity: Inactive (06/30/2024)   Exercise Vital Sign    Days of Exercise per Week: 0 days    Minutes of Exercise per  Session: 0 min  Stress: No Stress Concern Present (06/30/2024)   [employer] of Occupational Health - Occupational Stress Questionnaire    Feeling of Stress: Not at all  Social Connections: Moderately Isolated (06/30/2024)   Social Connection and Isolation Panel    Frequency of Communication with Friends and Family: More than three times a week    Frequency of Social Gatherings with Friends and Family: More than three times a week    Attends Religious Services: More than 4 times per year    Active Member of Clubs or Organizations: No    Attends Banker Meetings: Never    Marital Status: Divorced  Catering Manager Violence: Not At Risk (06/30/2024)   Epic    Fear of Current or Ex-Partner: No    Emotionally Abused: No    Physically Abused: No    Sexually Abused: No  Depression  (PHQ2-9): Low Risk (06/30/2024)   Depression (PHQ2-9)    PHQ-2 Score: 1  Alcohol Screen: Low Risk (06/28/2023)   Alcohol Screen    Last Alcohol Screening Score (AUDIT): 1  Housing: Low Risk (06/30/2024)   Epic    Unable to Pay for Housing in the Last Year: No    Number of Times Moved in the Last Year: 0    Homeless in the Last Year: No  Utilities: Not At Risk (06/30/2024)   Epic    Threatened with loss of utilities: No  Health Literacy: Adequate Health Literacy (06/30/2024)   B1300 Health Literacy    Frequency of need for help with medical instructions: Never    Medications: Show/hide medication list[1]  Allergies[2]  Patient Care Team: Orlean Alan HERO, FNP as PCP - General (Family Medicine)  Review of Systems  All other systems reviewed and are negative.     Objective:    Vitals: BP (!) 164/88   Pulse 63   Ht 5' 4 (1.626 m)   Wt 230 lb 6.4 oz (104.5 kg)   SpO2 90%   BMI 39.55 kg/m   Physical Exam Vitals and nursing note reviewed.  Constitutional:      Appearance: Normal appearance. She is normal weight.  HENT:     Head: Normocephalic.  Eyes:     Extraocular Movements: Extraocular movements intact.     Conjunctiva/sclera: Conjunctivae normal.     Pupils: Pupils are equal, round, and reactive to light.  Cardiovascular:     Rate and Rhythm: Normal rate.  Pulmonary:     Effort: Pulmonary effort is normal.  Neurological:     General: No focal deficit present.     Mental Status: She is alert and oriented to person, place, and time. Mental status is at baseline.  Psychiatric:        Mood and Affect: Mood normal.        Behavior: Behavior normal.        Thought Content: Thought content normal.      Most recent functional status assessment:    07/09/2023    6:12 PM  In your present state of health, do you have any difficulty performing the following activities:  Hearing? 0  Vision? 0  Difficulty concentrating or making decisions? 0  Walking or  climbing stairs? 0  Dressing or bathing? 0  Doing errands, shopping? 0  Preparing Food and eating ? N  Using the Toilet? N  In the past six months, have you accidently leaked urine? N  Do you have problems with loss of bowel control? N  Managing your Medications? N  Managing your Finances? N  Housekeeping or managing your Housekeeping? N    Most recent fall risk assessment:    06/30/2024    2:39 PM  Fall Risk   Falls in the past year? 0  Number falls in past yr: 0  Injury with Fall? 0  Risk for fall due to : Orthopedic patient     Most recent depression screenings:    06/30/2024    2:41 PM 07/23/2023    2:25 PM  PHQ 2/9 Scores  PHQ - 2 Score 1 2  PHQ- 9 Score  4      Data saved with a previous flowsheet row definition    Most recent cognitive screening:    06/30/2024    2:39 PM  6CIT Screen  What Year? 0 points  What month? 0 points  What time? 0 points  Count back from 20 0 points  Months in reverse 0 points  Repeat phrase 0 points  Total Score 0 points    No results found for any visits on 06/30/24.     Assessment & Plan:      Annual wellness visit done today including the all of the following: Reviewed patient's Family Medical History Reviewed and updated list of patient's medical providers Assessment of cognitive impairment was done Assessed patient's functional ability Established a written schedule for health screening services Health Risk Assessent Completed and Reviewed  Exercise Activities and Dietary recommendations  Goals      Absence of Fall and Fall-Related Injury     Evidence-based guidance:  Assess fall risk using a validated tool when available. Consider balance and gait impairment, muscle weakness, diminished vision or hearing, environmental hazards, presence of urinary or bowel urgency and/or incontinence.  Communicate fall injury risk to interprofessional healthcare team.  Develop a fall prevention plan with the patient and  family.  Promote use of personal vision and auditory aids.  Promote reorientation, appropriate sensory stimulation, and routines to decrease risk of fall when changes in mental status are present.  Assess assistance level required for safe and effective self-care; consider referral for home care.  Encourage physical activity, such as performance of self-care at highest level of ability, strength and balance exercise program, and provision of appropriate assistive devices; refer to rehabilitation therapy.  Refer to community-based fall prevention program where available.  If fall occurs, determine the cause and revise fall injury prevention plan.  Regularly review medication contribution to fall risk; consider risk related to polypharmacy and age.  Refer to pharmacist for consultation when concerns about medications are revealed.  Balance adequate pain management with potential for oversedation.  Provide guidance related to environmental modifications.  Consider supplementation with Vitamin D .   Notes:      CCM:  Monitor and Maintain HbA1c <8%     Maintain Mobility and Function     Evidence-based guidance:  Acknowledge and validate impact of pain, loss of strength and potential disfigurement (hand osteoarthritis) on mental health and daily life, such as social isolation, anxiety, depression, impaired sexual relationship and   injury from falls.  Anticipate referral to physical or occupational therapy for assessment, therapeutic exercise and recommendation for adaptive equipment or assistive devices; encourage participation.  Assess impact on ability to perform activities of daily living, as well as engage in sports and leisure events or requirements of work or school.  Provide anticipatory guidance and reassurance about the benefit of exercise to maintain function; acknowledge and normalize fear that exercise  may worsen symptoms.  Encourage regular exercise, at least 10 minutes at a time for 45  minutes per week; consider yoga, water exercise and proprioceptive exercises; encourage use of wearable activity tracker to increase motivation and adherence.  Encourage maintenance or resumption of daily activities, including employment, as pain allows and with minimal exposure to trauma.  Assist patient to advocate for adaptations to the work environment.  Consider level of pain and function, gender, age, lifestyle, patient preference, quality of life, readiness and capacity to benefit when recommending patients for orthopaedic surgery consultation.  Explore strategies, such as changes to medication regimen or activity that enables patient to anticipate and manage flare-ups that increase deconditioning and disability.  Explore patient preferences; encourage exposure to a broader range of activities that have been avoided for fear of experiencing pain.  Identify barriers to participation in therapy or exercise, such as pain with activity, anticipated or imagined pain.  Monitor postoperative joint replacement or any preexisting joint replacement for ongoing pain and loss of function; provide social support and encouragement throughout recovery.   Notes:         Immunization History  Administered Date(s) Administered   PNEUMOCOCCAL CONJUGATE-20 07/13/2022   Pfizer(Comirnaty)Fall Seasonal Vaccine 12 years and older 03/18/2023   Zoster Recombinant(Shingrix) 03/18/2023, 07/18/2023    Health Maintenance  Topic Date Due   DTaP/Tdap/Td (1 - Tdap) Never done   FOOT EXAM  05/02/2023   Influenza Vaccine  02/07/2024   COVID-19 Vaccine (2 - 2025-26 season) 03/09/2024   Medicare Annual Wellness (AWV)  06/27/2024   HEMOGLOBIN A1C  10/14/2024   Diabetic kidney evaluation - Urine ACR  12/26/2024   OPHTHALMOLOGY EXAM  01/12/2025   Diabetic kidney evaluation - eGFR measurement  04/15/2025   Mammogram  12/19/2025   Cervical Cancer Screening (HPV/Pap Cotest)  09/25/2028   Colonoscopy  12/04/2028    Pneumococcal Vaccine: 50+ Years  Completed   Hepatitis C Screening  Completed   HIV Screening  Completed   Zoster Vaccines- Shingrix  Completed   Hepatitis B Vaccines 19-59 Average Risk  Aged Out   HPV VACCINES  Aged Out   Meningococcal B Vaccine  Aged Out     Discussed health benefits of physical activity, and encouraged her to engage in regular exercise appropriate for her age and condition.        ALAN CHRISTELLA ARRANT, FNP   06/30/2024  This document may have been prepared by [facility] Voice Recognition software and as such may include unintentional dictation errors.      [1]  Outpatient Medications Prior to Visit  Medication Sig   albuterol  (VENTOLIN  HFA) 108 (90 Base) MCG/ACT inhaler Inhale 2 puffs into the lungs every 6 (six) hours as needed for wheezing or shortness of breath.   amitriptyline  (ELAVIL ) 25 MG tablet TAKE 2 TABLETS (50 MG TOTAL) BY MOUTH AT BEDTIME AS NEEDED FOR SLEEP.   amLODipine  (NORVASC ) 10 MG tablet Take 1 tablet (10 mg total) by mouth daily.   cyclobenzaprine  (FLEXERIL ) 5 MG tablet Take 1 tablet (5 mg total) by mouth 2 (two) times daily as needed.   fluticasone  (FLONASE ) 50 MCG/ACT nasal spray Place 1 spray into both nostrils in the morning and at bedtime.   Fluticasone -Umeclidin-Vilant (TRELEGY ELLIPTA ) 100-62.5-25 MCG/ACT AEPB INHALE 1 PUFF ONCE DAILY. RINCE MOUTH WELL AFTER USE.   Multiple Vitamins-Minerals (MULTIVITAMIN WOMEN 50+ PO) Take 1 tablet by mouth daily.   rosuvastatin  (CRESTOR ) 10 MG tablet Take 1 tablet (10 mg total) by mouth daily.  tirzepatide  (MOUNJARO ) 5 MG/0.5ML Pen Inject 5 mg into the skin once a week.   icosapent  Ethyl (VASCEPA ) 1 g capsule Take 2 capsules (2 g total) by mouth 2 (two) times daily. (Patient not taking: Reported on 06/30/2024)   Zoster Vaccine Adjuvanted [facility]) injection Inject 0.5 mLs into the muscle once for 1 dose. Repeat in 6 months (Patient not taking: Reported on 06/30/2024)   No facility-administered  medications prior to visit.  [2]  Allergies Allergen Reactions   Ace Inhibitors     swelling of lip/face and closing of throat   Sulfa Antibiotics Itching   "

## 2024-07-20 NOTE — Progress Notes (Signed)
 Tracy Hood                                          MRN: [id number]   07/20/2024   The [facility] Quality Team Specialist reviewed this patient medical record for the purposes of chart review for care gap closure. The following were reviewed: abstraction for care gap closure-glycemic status assessment.    [facility] Quality Team

## 2024-08-31 ENCOUNTER — Ambulatory Visit: Admitting: Family
